# Patient Record
Sex: Female | Born: 1935 | Race: White | Hispanic: No | Smoking: Never smoker
Health system: Southern US, Community
[De-identification: ages and names within clinical notes are randomized; demographics above are authoritative.]

## PROBLEM LIST (undated history)

## (undated) DIAGNOSIS — E119 Type 2 diabetes mellitus without complications: Secondary | ICD-10-CM

## (undated) DIAGNOSIS — C801 Malignant (primary) neoplasm, unspecified: Secondary | ICD-10-CM

## (undated) DIAGNOSIS — M1990 Unspecified osteoarthritis, unspecified site: Secondary | ICD-10-CM

## (undated) DIAGNOSIS — H269 Unspecified cataract: Secondary | ICD-10-CM

## (undated) HISTORY — DX: Unspecified osteoarthritis, unspecified site: M19.90

## (undated) HISTORY — DX: Unspecified cataract: H26.9

## (undated) HISTORY — PX: EYE SURGERY: SHX253

## (undated) HISTORY — PX: ABDOMINAL HYSTERECTOMY: SHX81

## (undated) HISTORY — DX: Type 2 diabetes mellitus without complications: E11.9

## (undated) HISTORY — DX: Malignant (primary) neoplasm, unspecified: C80.1

---

## 2006-07-24 ENCOUNTER — Emergency Department (HOSPITAL_COMMUNITY): Admission: EM | Admit: 2006-07-24 | Discharge: 2006-07-24 | Payer: Self-pay | Admitting: Emergency Medicine

## 2012-09-29 ENCOUNTER — Ambulatory Visit (INDEPENDENT_AMBULATORY_CARE_PROVIDER_SITE_OTHER): Payer: Medicare HMO | Admitting: Emergency Medicine

## 2012-09-29 VITALS — HR 74 | Temp 98.2°F | Resp 18 | Ht <= 58 in | Wt 121.0 lb

## 2012-09-29 DIAGNOSIS — J209 Acute bronchitis, unspecified: Secondary | ICD-10-CM

## 2012-09-29 MED ORDER — GUAIFENESIN-DM 100-10 MG/5ML PO SYRP: 5.0000 mL | ORAL_SOLUTION | Freq: Three times a day (TID) | ORAL | Status: DC | PRN

## 2012-09-29 MED ORDER — AZITHROMYCIN 250 MG PO TABS: ORAL_TABLET | ORAL | Status: DC

## 2012-09-29 NOTE — Progress Notes (Signed)
[facility] [address] [place] [address] [phone]  Date:  09/29/2012   Name:  Molly Ashley   DOB:  07-03-1913   MRN:  [id number]  PCP:  No primary Giavana Rooke on file.    Chief Complaint: Cough and Diarrhea   History of Present Illness:  Molly Ashley is a 79 y.o. very pleasant female patient who presents with the following:  Has a recent onset nonproductive cough not associated with wheezing or shortness of breath.  No fever or chills.  No nausea or vomiting.  No coryza.  Not worse when recumbent.  Has occasional loose stool.  No blood mucous or pus.  No abdominal pain.  Not watery.    There is no problem list on file for this patient.   Past Medical History  Diagnosis Date  . Arthritis   . Cancer   . Diabetes mellitus without complication   . Cataract     Past Surgical History  Procedure Laterality Date  . Abdominal hysterectomy    . Eye surgery      History  Substance Use Topics  . Smoking status: Never Smoker   . Smokeless tobacco: Not on file  . Alcohol Use: No    History reviewed. No pertinent family history.  No Known Allergies  Medication list has been reviewed and updated.  No current outpatient prescriptions on file prior to visit.   No current facility-administered medications on file prior to visit.    Review of Systems:  As per HPI, otherwise negative.    Physical Examination: Filed Vitals:   09/29/12 0954  Pulse: 74  Temp: 98.2 F (36.8 C)  Resp: 18   Filed Vitals:   09/29/12 0954  Height: 4' 8.5" (1.435 m)  Weight: 121 lb (54.885 kg)   Body mass index is 26.65 kg/(m^2). Ideal Body Weight: Weight in (lb) to have BMI = 25: 113.3  GEN: WDWN, NAD, Non-toxic, A & O x 3 HEENT: Atraumatic, Normocephalic. Neck supple. No masses, No LAD. Ears and Nose: No external deformity.  Bilateral hearing aids CV: RRR, No M/G/R. No JVD. No thrill. No extra heart sounds. PULM: CTA B, no wheezes, crackles, rhonchi.  No retractions. No resp. distress. No accessory muscle use. ABD: S, NT, ND, +BS. No rebound. No HSM. EXTR: No c/c/e NEURO Normal gait.  PSYCH: Normally interactive. Conversant. Not depressed or anxious appearing.  Calm demeanor.    Assessment and Plan: Bronchitis robitussion ac zpak  Carmelina Dane, MD

## 2012-09-29 NOTE — Patient Instructions (Addendum)

## 2012-12-03 ENCOUNTER — Ambulatory Visit: Payer: Self-pay | Admitting: Podiatry

## 2013-04-01 ENCOUNTER — Ambulatory Visit (INDEPENDENT_AMBULATORY_CARE_PROVIDER_SITE_OTHER): Payer: Medicare HMO | Admitting: Podiatry

## 2013-04-01 DIAGNOSIS — B351 Tinea unguium: Secondary | ICD-10-CM | POA: Insufficient documentation

## 2013-04-01 DIAGNOSIS — M25579 Pain in unspecified ankle and joints of unspecified foot: Secondary | ICD-10-CM | POA: Insufficient documentation

## 2013-04-01 NOTE — Progress Notes (Signed)
79 year old female presents complaining of painful feet. Left foot is swollen on. Patient came in to have toe nails trimmed.  Objective: Swollen left forefoot lateral 1/2 localized without inflammation or pain at site.  Thick yellow discolorated long nails x 10 with pain. Skin is paper thin with multiple areas of hyperpigmented skin from enlarged capillaries.  No open lesions. No proximal or expending edema or erythema. Pedal pulses are not palpable.  Assessment: Onychomycosis x 10. Painful feet.  Plan: All nails debrided x 10.

## 2013-05-04 ENCOUNTER — Emergency Department (HOSPITAL_COMMUNITY)
Admission: EM | Admit: 2013-05-04 | Discharge: 2013-05-05 | Disposition: A | Discharge status: Home / Self Care | Payer: Medicare HMO | Attending: Emergency Medicine | Admitting: Emergency Medicine

## 2013-05-04 ENCOUNTER — Emergency Department (HOSPITAL_COMMUNITY): Payer: Medicare HMO

## 2013-05-04 ENCOUNTER — Encounter (HOSPITAL_COMMUNITY): Payer: Self-pay | Admitting: *Deleted

## 2013-05-04 DIAGNOSIS — Z859 Personal history of malignant neoplasm, unspecified: Secondary | ICD-10-CM | POA: Insufficient documentation

## 2013-05-04 DIAGNOSIS — Z8669 Personal history of other diseases of the nervous system and sense organs: Secondary | ICD-10-CM | POA: Insufficient documentation

## 2013-05-04 DIAGNOSIS — Z79899 Other long term (current) drug therapy: Secondary | ICD-10-CM | POA: Insufficient documentation

## 2013-05-04 DIAGNOSIS — E119 Type 2 diabetes mellitus without complications: Secondary | ICD-10-CM | POA: Insufficient documentation

## 2013-05-04 DIAGNOSIS — R4182 Altered mental status, unspecified: Secondary | ICD-10-CM | POA: Insufficient documentation

## 2013-05-04 DIAGNOSIS — K5900 Constipation, unspecified: Secondary | ICD-10-CM | POA: Insufficient documentation

## 2013-05-04 DIAGNOSIS — Z7902 Long term (current) use of antithrombotics/antiplatelets: Secondary | ICD-10-CM | POA: Insufficient documentation

## 2013-05-04 DIAGNOSIS — Z8739 Personal history of other diseases of the musculoskeletal system and connective tissue: Secondary | ICD-10-CM | POA: Insufficient documentation

## 2013-05-04 LAB — CBC WITH DIFFERENTIAL/PLATELET
Basophils Absolute: 0.1 10*3/uL (ref 0.0–0.1)
HCT: 32.7 % — ABNORMAL LOW (ref 36.0–46.0)
Lymphocytes Relative: 15 % (ref 12–46)
Neutro Abs: 5 10*3/uL (ref 1.7–7.7)
Platelets: 189 10*3/uL (ref 150–400)
RBC: 3.53 MIL/uL — ABNORMAL LOW (ref 3.87–5.11)
RDW: 12.8 % (ref 11.5–15.5)
WBC: 7.8 10*3/uL (ref 4.0–10.5)

## 2013-05-04 LAB — COMPREHENSIVE METABOLIC PANEL
ALT: 15 U/L (ref 0–35)
AST: 20 U/L (ref 0–37)
Alkaline Phosphatase: 57 U/L (ref 39–117)
BUN: 29 mg/dL — ABNORMAL HIGH (ref 6–23)
CO2: 25 mEq/L (ref 19–32)
Chloride: 103 mEq/L (ref 96–112)
GFR calc Af Amer: 34 mL/min — ABNORMAL LOW (ref >90)
GFR calc non Af Amer: 29 mL/min — ABNORMAL LOW (ref >90)
Potassium: 4.6 mEq/L (ref 3.5–5.1)
Sodium: 136 mEq/L (ref 135–145)
Total Bilirubin: 0.3 mg/dL (ref 0.3–1.2)

## 2013-05-04 LAB — URINALYSIS, ROUTINE W REFLEX MICROSCOPIC
Bilirubin Urine: NEGATIVE
Hgb urine dipstick: NEGATIVE
Ketones, ur: NEGATIVE mg/dL
Nitrite: NEGATIVE
Protein, ur: 100 mg/dL — AB
Specific Gravity, Urine: 1.017 (ref 1.005–1.030)
Urobilinogen, UA: 0.2 mg/dL (ref 0.0–1.0)

## 2013-05-04 LAB — URINE MICROSCOPIC-ADD ON

## 2013-05-04 LAB — POCT I-STAT TROPONIN I: Troponin i, poc: 0 ng/mL (ref 0.00–0.08)

## 2013-05-04 MED ORDER — SODIUM CHLORIDE 0.9 % IV BOLUS (SEPSIS)
1000.0000 mL | Freq: Once | INTRAVENOUS | Status: AC
  Administered 2013-05-04: 1000 mL via INTRAVENOUS
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
## 2013-05-04 NOTE — ED Notes (Signed)
Per ems report, the patients family says the patient was not acting right tonight. The patient says that she was going to dinner at her families house and she could not remember what she did with her dinner. She says she does not know how long it lasted. No obvious weakness or slurred speech on arrival. Pt denies pain. Ambulatory from EMS stretcher to treatment room

## 2013-05-04 NOTE — ED Notes (Signed)
Bed: WA01 Expected date: 05/04/13 Expected time: 8:28 PM Means of arrival: Ambulance Comments: 79 yo F

## 2013-05-05 ENCOUNTER — Emergency Department (HOSPITAL_COMMUNITY): Payer: Medicare HMO

## 2013-05-12 NOTE — ED Provider Notes (Signed)
Medical screening examination/treatment/procedure(s) were conducted as a shared visit with non-physician practitioner(s) and myself.  I personally evaluated the patient during the encounter Pt with some worsening confusion, at baseline now.  No neuro deficits.  Labs unremarkable  Rolan Bucco, MD 05/12/13 2008

## 2013-10-17 ENCOUNTER — Encounter (HOSPITAL_BASED_OUTPATIENT_CLINIC_OR_DEPARTMENT_OTHER): Payer: Self-pay | Admitting: Emergency Medicine

## 2013-10-17 ENCOUNTER — Emergency Department (HOSPITAL_BASED_OUTPATIENT_CLINIC_OR_DEPARTMENT_OTHER)
Admission: EM | Admit: 2013-10-17 | Discharge: 2013-10-17 | Disposition: A | Discharge status: Home / Self Care | Payer: Medicare HMO | Attending: Emergency Medicine | Admitting: Emergency Medicine

## 2013-10-17 DIAGNOSIS — W64XXXA Exposure to other animate mechanical forces, initial encounter: Secondary | ICD-10-CM | POA: Insufficient documentation

## 2013-10-17 DIAGNOSIS — Y939 Activity, unspecified: Secondary | ICD-10-CM | POA: Insufficient documentation

## 2013-10-17 DIAGNOSIS — S91009A Unspecified open wound, unspecified ankle, initial encounter: Principal | ICD-10-CM

## 2013-10-17 DIAGNOSIS — Z859 Personal history of malignant neoplasm, unspecified: Secondary | ICD-10-CM | POA: Insufficient documentation

## 2013-10-17 DIAGNOSIS — Z7902 Long term (current) use of antithrombotics/antiplatelets: Secondary | ICD-10-CM | POA: Insufficient documentation

## 2013-10-17 DIAGNOSIS — Z8739 Personal history of other diseases of the musculoskeletal system and connective tissue: Secondary | ICD-10-CM | POA: Insufficient documentation

## 2013-10-17 DIAGNOSIS — Y929 Unspecified place or not applicable: Secondary | ICD-10-CM | POA: Insufficient documentation

## 2013-10-17 DIAGNOSIS — Z792 Long term (current) use of antibiotics: Secondary | ICD-10-CM | POA: Insufficient documentation

## 2013-10-17 DIAGNOSIS — S81009A Unspecified open wound, unspecified knee, initial encounter: Secondary | ICD-10-CM | POA: Insufficient documentation

## 2013-10-17 DIAGNOSIS — S81809A Unspecified open wound, unspecified lower leg, initial encounter: Principal | ICD-10-CM

## 2013-10-17 DIAGNOSIS — S81819A Laceration without foreign body, unspecified lower leg, initial encounter: Secondary | ICD-10-CM

## 2013-10-17 DIAGNOSIS — Z8669 Personal history of other diseases of the nervous system and sense organs: Secondary | ICD-10-CM | POA: Insufficient documentation

## 2013-10-17 DIAGNOSIS — Z79899 Other long term (current) drug therapy: Secondary | ICD-10-CM | POA: Insufficient documentation

## 2013-10-17 DIAGNOSIS — E119 Type 2 diabetes mellitus without complications: Secondary | ICD-10-CM | POA: Insufficient documentation

## 2013-10-17 MED ORDER — AMOXICILLIN-POT CLAVULANATE 875-125 MG PO TABS: 1.0000 | ORAL_TABLET | Freq: Two times a day (BID) | ORAL | Status: DC

## 2013-10-17 NOTE — ED Provider Notes (Signed)
CSN: [id number]     Arrival date & time 10/17/13  1643 History   First MD Initiated Contact with Patient 10/17/13 1647     Chief Complaint  Patient presents with  . Extremity Laceration     (Consider location/radiation/quality/duration/timing/severity/associated sxs/prior Treatment) HPI Comments: Pt state that her granddaughters dog jumped on the pt and the dogs nail scratched her right lower leg a short time ago. Pt cleaned the area and dressed it. Dogs shots are utd. No concern for fb. Pt tetanus is utd;denies numbness or weakness  The history is provided by the patient. No language interpreter was used.    Past Medical History  Diagnosis Date  . Arthritis   . Cancer   . Diabetes mellitus without complication   . Cataract    Past Surgical History  Procedure Laterality Date  . Abdominal hysterectomy    . Eye surgery     No family history on file. History  Substance Use Topics  . Smoking status: Never Smoker   . Smokeless tobacco: Not on file  . Alcohol Use: No   OB History   Grav Para Term Preterm Abortions TAB SAB Ect Mult Living                 Review of Systems  Constitutional: Negative.   Respiratory: Negative.   Cardiovascular: Negative.       Allergies  Review of patient's allergies indicates no known allergies.  Home Medications   Current Outpatient Rx  Name  Route  Sig  Dispense  Refill  . clopidogrel (PLAVIX) 75 MG tablet   Oral   Take 75 mg by mouth daily.         Marland Kitchen doxycycline (VIBRAMYCIN) 100 MG capsule   Oral   Take 100 mg by mouth 2 (two) times daily. Started 05/02/13  14 days         . glipiZIDE (GLUCOTROL) 5 MG tablet   Oral   Take 5 mg by mouth daily.          Vladimir Faster Glycol-Propyl Glycol (SYSTANE OP)   Ophthalmic   Apply 1 drop to eye as needed (dry eyes).         . propranolol (INDERAL) 60 MG tablet   Oral   Take 30 mg by mouth 2 (two) times daily.         . simvastatin (ZOCOR) 10 MG tablet   Oral   Take 10  mg by mouth at bedtime.         . sitaGLIPtin (JANUVIA) 25 MG tablet   Oral   Take 25 mg by mouth daily.         . traMADol (ULTRAM) 50 MG tablet   Oral   Take 50 mg by mouth every 12 (twelve) hours as needed for pain.         Marland Kitchen triamcinolone cream (KENALOG) 0.1 %                BP   Pulse 80  Temp(Src) 98.6 F (37 C) (Oral)  SpO2 100% Physical Exam  Nursing note and vitals reviewed. Constitutional: She is oriented to person, place, and time. She appears well-developed and well-nourished.  Cardiovascular: Normal rate and regular rhythm.   Pulmonary/Chest: Effort normal.  Musculoskeletal: Normal range of motion.  Neurological: She is alert and oriented to person, place, and time.  Skin:  Pt has a vertical laceration to the right shin 13 cm in length. Pt ambulatory without any problem  ED Course  LACERATION REPAIR Date/Time: 10/17/2013 5:11 PM Performed by: Glendell Docker Authorized by: Glendell Docker Consent: Verbal consent obtained. Risks and benefits: risks, benefits and alternatives were discussed Consent given by: patient Patient identity confirmed: verbally with patient Body area: lower extremity Location details: right lower leg Laceration length: 12 cm Foreign bodies: no foreign bodies Amount of cleaning: standard Skin closure: glue and Steri-Strips Approximation: close Approximation difficulty: simple Comments: Do to thin skin it was repaired with glue and steri strips   (including critical care time) Labs Review Labs Reviewed - No data to display Imaging Review No results found.   EKG Interpretation None      MDM   Final diagnoses:  Laceration of leg    Will treat with antibiotics related to it being a dog injury   Glendell Docker, NP 10/17/13 1712

## 2013-10-17 NOTE — ED Notes (Signed)
Pt's granddaughter's dog jumped up on pt bare right leg-scratched with nails-dog's UTD

## 2013-10-17 NOTE — Discharge Instructions (Signed)

## 2013-10-17 NOTE — ED Provider Notes (Signed)
Medical screening examination/treatment/procedure(s) were performed by non-physician practitioner and as supervising physician I was immediately available for consultation/collaboration.   EKG Interpretation None        Blanchard Kelch, MD 10/17/13 2325

## 2013-12-24 ENCOUNTER — Ambulatory Visit: Payer: Medicare HMO | Admitting: Podiatry

## 2014-01-27 ENCOUNTER — Ambulatory Visit (INDEPENDENT_AMBULATORY_CARE_PROVIDER_SITE_OTHER): Payer: Medicare HMO | Admitting: Podiatry

## 2014-01-27 ENCOUNTER — Encounter: Payer: Self-pay | Admitting: Podiatry

## 2014-01-27 DIAGNOSIS — M79609 Pain in unspecified limb: Secondary | ICD-10-CM

## 2014-01-27 DIAGNOSIS — M79606 Pain in leg, unspecified: Secondary | ICD-10-CM | POA: Insufficient documentation

## 2014-01-27 DIAGNOSIS — B351 Tinea unguium: Secondary | ICD-10-CM

## 2014-01-27 NOTE — Progress Notes (Signed)
SUBJECTIVE: 80 y.o. year old female presents requesting toe nails trimmed. She has been cut too many times and is scared to have anyone to cut her nails.   OBJECTIVE: DERMATOLOGIC EXAMINATION: Nails: Thick and dystrophic with yellow debris.  Skin is pale, thin, and flaky. VASCULAR EXAMINATION OF LOWER LIMBS: Pedal pulses: Pedal pulses are not palpable. No edema or erythema.  NEUROLOGIC EXAMINATION OF THE LOWER LIMBS: Decreased response to Monofilament sensory testing.  MUSCULOSKELETAL EXAMINATION: Rectus foot without gross deformities.  ASSESSMENT: Onychomycosis. PVD.  PLAN: Palliation prn. All nails debrided.

## 2014-01-27 NOTE — Patient Instructions (Signed)
Seen for hypertrophic nails. All nails debrided. Return in 3 months or as needed.  

## 2014-04-29 ENCOUNTER — Ambulatory Visit: Payer: Medicare HMO | Admitting: Podiatry

## 2014-06-24 ENCOUNTER — Encounter (HOSPITAL_COMMUNITY): Payer: Self-pay | Admitting: Emergency Medicine

## 2014-06-24 ENCOUNTER — Emergency Department (HOSPITAL_COMMUNITY)
Admission: EM | Admit: 2014-06-24 | Discharge: 2014-06-24 | Disposition: A | Discharge status: Home / Self Care | Payer: Medicare HMO | Attending: Emergency Medicine | Admitting: Emergency Medicine

## 2014-06-24 ENCOUNTER — Emergency Department (HOSPITAL_COMMUNITY): Payer: Medicare HMO

## 2014-06-24 DIAGNOSIS — Z792 Long term (current) use of antibiotics: Secondary | ICD-10-CM | POA: Insufficient documentation

## 2014-06-24 DIAGNOSIS — S0083XA Contusion of other part of head, initial encounter: Secondary | ICD-10-CM | POA: Diagnosis not present

## 2014-06-24 DIAGNOSIS — S40012A Contusion of left shoulder, initial encounter: Secondary | ICD-10-CM | POA: Diagnosis not present

## 2014-06-24 DIAGNOSIS — Y9289 Other specified places as the place of occurrence of the external cause: Secondary | ICD-10-CM | POA: Insufficient documentation

## 2014-06-24 DIAGNOSIS — Z8669 Personal history of other diseases of the nervous system and sense organs: Secondary | ICD-10-CM | POA: Diagnosis not present

## 2014-06-24 DIAGNOSIS — E119 Type 2 diabetes mellitus without complications: Secondary | ICD-10-CM | POA: Diagnosis not present

## 2014-06-24 DIAGNOSIS — Y998 Other external cause status: Secondary | ICD-10-CM | POA: Insufficient documentation

## 2014-06-24 DIAGNOSIS — M1990 Unspecified osteoarthritis, unspecified site: Secondary | ICD-10-CM | POA: Diagnosis not present

## 2014-06-24 DIAGNOSIS — Y9389 Activity, other specified: Secondary | ICD-10-CM | POA: Diagnosis not present

## 2014-06-24 DIAGNOSIS — Z9849 Cataract extraction status, unspecified eye: Secondary | ICD-10-CM | POA: Diagnosis not present

## 2014-06-24 DIAGNOSIS — Z859 Personal history of malignant neoplasm, unspecified: Secondary | ICD-10-CM | POA: Insufficient documentation

## 2014-06-24 DIAGNOSIS — W19XXXA Unspecified fall, initial encounter: Secondary | ICD-10-CM | POA: Insufficient documentation

## 2014-06-24 DIAGNOSIS — Z79899 Other long term (current) drug therapy: Secondary | ICD-10-CM | POA: Diagnosis not present

## 2014-06-24 DIAGNOSIS — S4992XA Unspecified injury of left shoulder and upper arm, initial encounter: Secondary | ICD-10-CM | POA: Diagnosis present

## 2014-06-24 NOTE — ED Notes (Signed)
Pt ambulated on the hallway with assistance, pt still very unsteady on her feet, pt encouraged to use a walker or a cane at home, but pt refuses to do this, states she still strong to walk by her self.

## 2014-06-24 NOTE — ED Notes (Signed)
Pt c/o very bad leg cramping.

## 2014-06-24 NOTE — ED Notes (Signed)
Pt refusing discharge vitals, insisting that she needs to leave.
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
## 2014-06-24 NOTE — Discharge Instructions (Signed)
Fall Prevention and Home Safety Falls cause injuries and can affect all age groups. It is possible to use preventive measures to significantly decrease the likelihood of falls. There are many simple measures which can make your home safer and prevent falls. OUTDOORS  Repair cracks and edges of walkways and driveways.  Remove high doorway thresholds.  Trim shrubbery on the main path into your home.  Have good outside lighting.  Clear walkways of tools, rocks, debris, and clutter.  Check that handrails are not broken and are securely fastened. Both sides of steps should have handrails.  Have leaves, snow, and ice cleared regularly.  Use sand or salt on walkways during winter months.  In the garage, clean up grease or oil spills. BATHROOM  Install night lights.  Install grab bars by the toilet and in the tub and shower.  Use non-skid mats or decals in the tub or shower.  Place a plastic non-slip stool in the shower to sit on, if needed.  Keep floors dry and clean up all water on the floor immediately.  Remove soap buildup in the tub or shower on a regular basis.  Secure bath mats with non-slip, double-sided rug tape.  Remove throw rugs and tripping hazards from the floors. BEDROOMS  Install night lights.  Make sure a bedside light is easy to reach.  Do not use oversized bedding.  Keep a telephone by your bedside.  Have a firm chair with side arms to use for getting dressed.  Remove throw rugs and tripping hazards from the floor. KITCHEN  Keep handles on pots and pans turned toward the center of the stove. Use back burners when possible.  Clean up spills quickly and allow time for drying.  Avoid walking on wet floors.  Avoid hot utensils and knives.  Position shelves so they are not too high or low.  Place commonly used objects within easy reach.  If necessary, use a sturdy step stool with a grab bar when reaching.  Keep electrical cables out of the  way.  Do not use floor polish or wax that makes floors slippery. If you must use wax, use non-skid floor wax.  Remove throw rugs and tripping hazards from the floor. STAIRWAYS  Never leave objects on stairs.  Place handrails on both sides of stairways and use them. Fix any loose handrails. Make sure handrails on both sides of the stairways are as long as the stairs.  Check carpeting to make sure it is firmly attached along stairs. Make repairs to worn or loose carpet promptly.  Avoid placing throw rugs at the top or bottom of stairways, or properly secure the rug with carpet tape to prevent slippage. Get rid of throw rugs, if possible.  Have an electrician put in a light switch at the top and bottom of the stairs. OTHER FALL PREVENTION TIPS  Wear low-heel or rubber-soled shoes that are supportive and fit well. Wear closed toe shoes.  When using a stepladder, make sure it is fully opened and both spreaders are firmly locked. Do not climb a closed stepladder.  Add color or contrast paint or tape to grab bars and handrails in your home. Place contrasting color strips on first and last steps.  Learn and use mobility aids as needed. Install an electrical emergency response system.  Turn on lights to avoid dark areas. Replace light bulbs that burn out immediately. Get light switches that glow.  Arrange furniture to create clear pathways. Keep furniture in the same place.  Firmly attach carpet with non-skid or double-sided tape.  Eliminate uneven floor surfaces.  Select a carpet pattern that does not visually hide the edge of steps.  Be aware of all pets. OTHER HOME SAFETY TIPS  Set the water temperature for 120 F (48.8 C).  Keep emergency numbers on or near the telephone.  Keep smoke detectors on every level of the home and near sleeping areas. Document Released: 07/07/2002 Document Revised: 01/16/2012 Document Reviewed: 10/06/2011 [facility] Patient Information 2015  [place], [place]. This information is not intended to replace advice given to you by your health care provider. Make sure you discuss any questions you have with your health care provider.  Facial or Scalp Contusion A facial or scalp contusion is a deep bruise on the face or head. Injuries to the face and head generally cause a lot of swelling, especially around the eyes. Contusions are the result of an injury that caused bleeding under the skin. The contusion may turn blue, purple, or yellow. Minor injuries will give you a painless contusion, but more severe contusions may stay painful and swollen for a few weeks.  CAUSES  A facial or scalp contusion is caused by a blunt injury or trauma to the face or head area.  SIGNS AND SYMPTOMS   Swelling of the injured area.   Discoloration of the injured area.   Tenderness, soreness, or pain in the injured area.  DIAGNOSIS  The diagnosis can be made by taking a medical history and doing a physical exam. An X-ray exam, CT scan, or MRI may be needed to determine if there are any associated injuries, such as broken bones (fractures). TREATMENT  Often, the best treatment for a facial or scalp contusion is applying cold compresses to the injured area. Over-the-counter medicines may also be recommended for pain control.  HOME CARE INSTRUCTIONS   Only take over-the-counter or prescription medicines as directed by your health care provider.   Apply ice to the injured area.   Put ice in a plastic bag.   Place a towel between your skin and the bag.   Leave the ice on for 20 minutes, 2-3 times a day.  SEEK MEDICAL CARE IF:  You have bite problems.   You have pain with chewing.   You are concerned about facial defects. SEEK IMMEDIATE MEDICAL CARE IF:  You have severe pain or a headache that is not relieved by medicine.   You have unusual sleepiness, confusion, or personality changes.   You throw up (vomit).   You have a persistent  nosebleed.   You have double vision or blurred vision.   You have fluid drainage from your nose or ear.   You have difficulty walking or using your arms or legs.  MAKE SURE YOU:   Understand these instructions.  Will watch your condition.  Will get help right away if you are not doing well or get worse. Document Released: 08/24/2004 Document Revised: 05/07/2013 Document Reviewed: 02/27/2013 [facility] Patient Information 2015 [place], [place]. This information is not intended to replace advice given to you by your health care provider. Make sure you discuss any questions you have with your health care provider.  Contusion A contusion is a deep bruise. Contusions are the result of an injury that caused bleeding under the skin. The contusion may turn blue, purple, or yellow. Minor injuries will give you a painless contusion, but more severe contusions may stay painful and swollen for a few weeks.  CAUSES  A contusion is  usually caused by a blow, trauma, or direct force to an area of the body. SYMPTOMS   Swelling and redness of the injured area.  Bruising of the injured area.  Tenderness and soreness of the injured area.  Pain. DIAGNOSIS  The diagnosis can be made by taking a history and physical exam. An X-ray, CT scan, or MRI may be needed to determine if there were any associated injuries, such as fractures. TREATMENT  Specific treatment will depend on what area of the body was injured. In general, the best treatment for a contusion is resting, icing, elevating, and applying cold compresses to the injured area. Over-the-counter medicines may also be recommended for pain control. Ask your caregiver what the best treatment is for your contusion. HOME CARE INSTRUCTIONS   Put ice on the injured area.  Put ice in a plastic bag.  Place a towel between your skin and the bag.  Leave the ice on for 15-20 minutes, 3-4 times a day, or as directed by your health care provider.  Only  take over-the-counter or prescription medicines for pain, discomfort, or fever as directed by your caregiver. Your caregiver may recommend avoiding anti-inflammatory medicines (aspirin, ibuprofen, and naproxen) for 48 hours because these medicines may increase bruising.  Rest the injured area.  If possible, elevate the injured area to reduce swelling. SEEK IMMEDIATE MEDICAL CARE IF:   You have increased bruising or swelling.  You have pain that is getting worse.  Your swelling or pain is not relieved with medicines. MAKE SURE YOU:   Understand these instructions.  Will watch your condition.  Will get help right away if you are not doing well or get worse. Document Released: 04/26/2005 Document Revised: 07/22/2013 Document Reviewed: 05/22/2011 [facility] Patient Information 2015 [place], [place]. This information is not intended to replace advice given to you by your health care provider. Make sure you discuss any questions you have with your health care provider.

## 2014-06-24 NOTE — ED Provider Notes (Signed)
CSN: [id number]     Arrival date & time 06/24/14  0108 History  This chart was scribed for Molly Delay, MD by Evelene Croon, ED Scribe. This patient was seen in room B16C/B16C and the patient's care was started 1:21 AM.    Chief Complaint  Patient presents with  . Fall    The history is provided by the patient. No language interpreter was used.    HPI Comments:  Molly Ashley is a 80 y.o. female who presents to the Emergency Department s/p unwitnessed fall this am complaining of moderate-severe pain to her left shoulder following the incident. She reports associated pain surrounding site of bruising to left forehead where she injured her head during fall and mild pain to her left knee. She states she was in her bedroom when she fell but cannot recall what caused the fall. She denies LOC and a h/o recent falls. She also denies back pain. No alleviating factors noted. She has a h/o fx to the right shoulder.   Past Medical History  Diagnosis Date  . Arthritis   . Cancer   . Diabetes mellitus without complication   . Cataract    Past Surgical History  Procedure Laterality Date  . Abdominal hysterectomy    . Eye surgery     No family history on file. History  Substance Use Topics  . Smoking status: Never Smoker   . Smokeless tobacco: Never Used  . Alcohol Use: No   OB History    No data available     Review of Systems  Musculoskeletal: Positive for myalgias and arthralgias. Negative for back pain.  Skin: Positive for color change.  All other systems reviewed and are negative.     Allergies  Review of patient's allergies indicates no known allergies.  Home Medications   Prior to Admission medications   Medication Sig Start Date End Date Taking? Authorizing Provider  amoxicillin-clavulanate (AUGMENTIN) 875-125 MG per tablet Take 1 tablet by mouth every 12 (twelve) hours. 10/17/13   Glendell Docker, NP  clopidogrel (PLAVIX) 75 MG tablet Take 75 mg by mouth daily.     Historical Provider, MD  doxycycline (VIBRAMYCIN) 100 MG capsule Take 100 mg by mouth 2 (two) times daily. Started 05/02/13  14 days    Historical Provider, MD  glipiZIDE (GLUCOTROL) 5 MG tablet Take 5 mg by mouth daily.  03/23/13   Historical Provider, MD  Polyethyl Glycol-Propyl Glycol (SYSTANE OP) Apply 1 drop to eye as needed (dry eyes).    Historical Provider, MD  propranolol (INDERAL) 60 MG tablet Take 30 mg by mouth 2 (two) times daily.    Historical Provider, MD  simvastatin (ZOCOR) 10 MG tablet Take 10 mg by mouth at bedtime.    Historical Provider, MD  sitaGLIPtin (JANUVIA) 25 MG tablet Take 25 mg by mouth daily.    Historical Provider, MD  traMADol (ULTRAM) 50 MG tablet Take 50 mg by mouth every 12 (twelve) hours as needed for pain.    Historical Provider, MD  triamcinolone cream (KENALOG) 0.1 %  02/04/13   Historical Provider, MD   There were no vitals taken for this visit. Physical Exam  Constitutional: She is oriented to person, place, and time. She appears well-developed and well-nourished. No distress.  HENT:  Head: Normocephalic and atraumatic. Head is without raccoon's eyes and without Battle's sign.    Nose: Nose normal.  Eyes: Conjunctivae and EOM are normal. Pupils are equal, round, and reactive to light. No scleral icterus.  Neck: No spinous process tenderness and no muscular tenderness present.  Cardiovascular: Normal rate, regular rhythm, normal heart sounds and intact distal pulses.   No murmur heard. Pulmonary/Chest: Effort normal and breath sounds normal. She has no rales. She exhibits no tenderness.  Abdominal: Soft. There is no tenderness. There is no rebound and no guarding.  Musculoskeletal: Normal range of motion. She exhibits no edema.       Left shoulder: She exhibits tenderness and bony tenderness. She exhibits no deformity, no laceration, normal pulse and normal strength.       Thoracic back: She exhibits no tenderness and no bony tenderness.       Lumbar  back: She exhibits no tenderness and no bony tenderness.  No evidence of trauma to extremities, except as noted.  2+ distal pulses.    Neurological: She is alert and oriented to person, place, and time.  Skin: Skin is warm and dry. No rash noted.  Psychiatric: She has a normal mood and affect.  Nursing note and vitals reviewed.   ED Course  Procedures  DIAGNOSTIC STUDIES:  Oxygen Saturation is 100% on RA, normal by my interpretation.    COORDINATION OF CARE:  1:30 AM Will order x-rays of the left shoulder. Discussed treatment plan with pt at bedside and pt agreed to plan. Pt declined pain meds at this time.  Labs Review Labs Reviewed - No data to display  Imaging Review Ct Head Wo Contrast  06/24/2014   CLINICAL DATA:  Unwitnessed fall.  EXAM: CT HEAD WITHOUT CONTRAST  CT CERVICAL SPINE WITHOUT CONTRAST  TECHNIQUE: Multidetector CT imaging of the head and cervical spine was performed following the standard protocol without intravenous contrast. Multiplanar CT image reconstructions of the cervical spine were also generated.  COMPARISON:  None.  FINDINGS: CT HEAD FINDINGS  Prominence of the sulci and ventricles are identified compatible with brain atrophy. There is mild low attenuation within the subcortical and periventricular white matter. No acute cortical infarct, hemorrhage, or mass lesion ispresent. No significant extra-axial fluid collection is present. The paranasal sinuses andmastoid air cells are clear. The osseous skull is intact. Left frontal scalp hematoma is identified which measures 2.1 x 0.9 cm. There is no underlying skull fracture.  CT CERVICAL SPINE FINDINGS  Normal alignment of the cervical spine. There is multi level disc space narrowing and ventral endplate spurring identified. The prevertebral soft tissue space appears normal. The facet joints are aligned.  IMPRESSION: 1. No acute intracranial abnormalities. 2. Small vessel ischemic disease and brain atrophy. 3. No  evidence for cervical spine fracture. 4. Cervical spondylosis.   Electronically Signed   By: Kerby Moors M.D.   On: 06/24/2014 02:44   Ct Cervical Spine Wo Contrast  06/24/2014   CLINICAL DATA:  Unwitnessed fall.  EXAM: CT HEAD WITHOUT CONTRAST  CT CERVICAL SPINE WITHOUT CONTRAST  TECHNIQUE: Multidetector CT imaging of the head and cervical spine was performed following the standard protocol without intravenous contrast. Multiplanar CT image reconstructions of the cervical spine were also generated.  COMPARISON:  None.  FINDINGS: CT HEAD FINDINGS  Prominence of the sulci and ventricles are identified compatible with brain atrophy. There is mild low attenuation within the subcortical and periventricular white matter. No acute cortical infarct, hemorrhage, or mass lesion ispresent. No significant extra-axial fluid collection is present. The paranasal sinuses andmastoid air cells are clear. The osseous skull is intact. Left frontal scalp hematoma is identified which measures 2.1 x 0.9 cm. There is no underlying skull  fracture.  CT CERVICAL SPINE FINDINGS  Normal alignment of the cervical spine. There is multi level disc space narrowing and ventral endplate spurring identified. The prevertebral soft tissue space appears normal. The facet joints are aligned.  IMPRESSION: 1. No acute intracranial abnormalities. 2. Small vessel ischemic disease and brain atrophy. 3. No evidence for cervical spine fracture. 4. Cervical spondylosis.   Electronically Signed   By: Kerby Moors M.D.   On: 06/24/2014 02:44   Dg Shoulder Left  06/24/2014   CLINICAL DATA:  Fall with left shoulder bruising.  EXAM: LEFT SHOULDER - 2+ VIEW  COMPARISON:  None.  FINDINGS: Located glenohumeral and acromioclavicular joints. No convincing fracture. There is chronic insertional changes to the greater tuberosity. Spurring is noted around the glenoid. Osteopenia.  IMPRESSION: No acute osseous findings.   Electronically Signed   By: Jorje Guild M.D.   On: 06/24/2014 03:06   Dg Knee Complete 4 Views Left  06/24/2014   CLINICAL DATA:  Fall with left knee bruising.  Initial encounter  EXAM: LEFT KNEE - COMPLETE 4+ VIEW  COMPARISON:  None.  FINDINGS: There is no evidence of fracture, dislocation, or joint effusion. There is mild diffuse joint narrowing without significant spurring.  Osteopenia.  SFA atherosclerosis.  IMPRESSION: No acute osseous findings.   Electronically Signed   By: Jorje Guild M.D.   On: 06/24/2014 03:08  All radiology studies independently viewed by me.      EKG Interpretation   Date/Time:  Wednesday June 24 2014 01:22:55 EST Ventricular Rate:  69 PR Interval:  207 QRS Duration: 88 QT Interval:  425 QTC Calculation: 455 R Axis:   -16 Text Interpretation:  Sinus rhythm Borderline left axis deviation Anterior  infarct, old Baseline wander in lead(s) V4 No significant change was found  Confirmed by [facility]  MD, TREY ([id number]) on 06/24/2014 2:21:46 AM      MDM   Final diagnoses:  Fall  Forehead contusion, initial encounter  Shoulder contusion, left, initial encounter    80 yo female presenting after an unwitnessed fall.  Her daughter thinks she may have fallen out of bed. Complains of pain in her forehead and left shoulder. Imaging was negative. Exam is reassuring.   she was very well appearing, especially for her age.  Of note, her blood pressure is listed as 280/81. However, patient was very uncooperative while staff was attempting to obtain this vital sign. Pt unwilling to let us re-attempt BP.  Her    I personally performed the services described in this documentation, which was scribed in my presence. The recorded information has been reviewed and is accurate.     Molly Delay, MD 06/24/14 [id number]

## 2014-06-27 ENCOUNTER — Emergency Department (HOSPITAL_COMMUNITY): Payer: Medicare HMO

## 2014-06-27 ENCOUNTER — Encounter (HOSPITAL_COMMUNITY): Payer: Self-pay | Admitting: *Deleted

## 2014-06-27 ENCOUNTER — Emergency Department (HOSPITAL_COMMUNITY)
Admission: EM | Admit: 2014-06-27 | Discharge: 2014-06-27 | Disposition: A | Discharge status: Home / Self Care | Payer: Medicare HMO | Attending: Emergency Medicine | Admitting: Emergency Medicine

## 2014-06-27 DIAGNOSIS — W1839XD Other fall on same level, subsequent encounter: Secondary | ICD-10-CM | POA: Insufficient documentation

## 2014-06-27 DIAGNOSIS — S40022D Contusion of left upper arm, subsequent encounter: Secondary | ICD-10-CM

## 2014-06-27 DIAGNOSIS — W19XXXA Unspecified fall, initial encounter: Secondary | ICD-10-CM

## 2014-06-27 DIAGNOSIS — Z8589 Personal history of malignant neoplasm of other organs and systems: Secondary | ICD-10-CM | POA: Insufficient documentation

## 2014-06-27 DIAGNOSIS — Z79899 Other long term (current) drug therapy: Secondary | ICD-10-CM | POA: Insufficient documentation

## 2014-06-27 DIAGNOSIS — M1990 Unspecified osteoarthritis, unspecified site: Secondary | ICD-10-CM | POA: Insufficient documentation

## 2014-06-27 DIAGNOSIS — E119 Type 2 diabetes mellitus without complications: Secondary | ICD-10-CM | POA: Insufficient documentation

## 2014-06-27 LAB — CBG MONITORING, ED: Glucose-Capillary: 226 mg/dL — ABNORMAL HIGH (ref 70–99)

## 2014-06-27 MED ORDER — TRAMADOL HCL 50 MG PO TABS: 50.0000 mg | ORAL_TABLET | Freq: Two times a day (BID) | ORAL | Status: DC | PRN

## 2014-06-27 MED ORDER — TRAMADOL HCL 50 MG PO TABS: 50.0000 mg | ORAL_TABLET | Freq: Four times a day (QID) | ORAL | Status: DC | PRN

## 2014-06-27 MED ORDER — TRAMADOL HCL 50 MG PO TABS
50.0000 mg | ORAL_TABLET | Freq: Once | ORAL | Status: AC
  Administered 2014-06-27: 50 mg via ORAL
  Filled 2014-06-27: qty 1

## 2014-06-27 NOTE — ED Notes (Signed)
Pt with family for re-evaluation after a fall on Wednesday, pt seen for same at that time and xrays of left shoulder were negative, pt has continued pain and swelling to left hand, forearm and upper arm, states movement creates pain in left shoulder, denies any new symptoms

## 2014-06-27 NOTE — Discharge Instructions (Signed)
Contusion °A contusion is a deep bruise. Contusions are the result of an injury that caused bleeding under the skin. The contusion may turn blue, purple, or yellow. Minor injuries will give you a painless contusion, but more severe contusions may stay painful and swollen for a few weeks.  °CAUSES  °A contusion is usually caused by a blow, trauma, or direct force to an area of the body. °SYMPTOMS  °· Swelling and redness of the injured area. °· Bruising of the injured area. °· Tenderness and soreness of the injured area. °· Pain. °DIAGNOSIS  °The diagnosis can be made by taking a history and physical exam. An X-ray, CT scan, or MRI may be needed to determine if there were any associated injuries, such as fractures. °TREATMENT  °Specific treatment will depend on what area of the body was injured. In general, the best treatment for a contusion is resting, icing, elevating, and applying cold compresses to the injured area. Over-the-counter medicines may also be recommended for pain control. Ask your caregiver what the best treatment is for your contusion. °HOME CARE INSTRUCTIONS  °· Put ice on the injured area. °¨ Put ice in a plastic bag. °¨ Place a towel between your skin and the bag. °¨ Leave the ice on for 15-20 minutes, 3-4 times a day, or as directed by your health care provider. °· Only take over-the-counter or prescription medicines for pain, discomfort, or fever as directed by your caregiver. Your caregiver may recommend avoiding anti-inflammatory medicines (aspirin, ibuprofen, and naproxen) for 48 hours because these medicines may increase bruising. °· Rest the injured area. °· If possible, elevate the injured area to reduce swelling. °SEEK IMMEDIATE MEDICAL CARE IF:  °· You have increased bruising or swelling. °· You have pain that is getting worse. °· Your swelling or pain is not relieved with medicines. °MAKE SURE YOU:  °· Understand these instructions. °· Will watch your condition. °· Will get help right  away if you are not doing well or get worse. °Document Released: 04/26/2005 Document Revised: 07/22/2013 Document Reviewed: 05/22/2011 °[employer]® Patient Information ©2015 [employer]. This information is not intended to replace advice given to you by your health care provider. Make sure you discuss any questions you have with your health care provider. ° °

## 2014-06-30 ENCOUNTER — Encounter (HOSPITAL_COMMUNITY): Payer: Self-pay

## 2014-06-30 ENCOUNTER — Inpatient Hospital Stay (HOSPITAL_COMMUNITY): Payer: Medicare HMO

## 2014-06-30 ENCOUNTER — Emergency Department (HOSPITAL_COMMUNITY): Payer: Medicare HMO

## 2014-06-30 ENCOUNTER — Inpatient Hospital Stay (HOSPITAL_COMMUNITY)
Admission: EM | Admit: 2014-06-30 | Discharge: 2014-07-06 | DRG: 871 | Disposition: A | Discharge status: Skilled Nursing Facility | Payer: Medicare HMO | Attending: Internal Medicine | Admitting: Internal Medicine

## 2014-06-30 ENCOUNTER — Encounter (HOSPITAL_COMMUNITY): Payer: Self-pay | Admitting: Emergency Medicine

## 2014-06-30 ENCOUNTER — Emergency Department (HOSPITAL_COMMUNITY)
Admission: EM | Admit: 2014-06-30 | Discharge: 2014-06-30 | Disposition: A | Discharge status: Home / Self Care | Payer: Medicare HMO | Source: Home / Self Care | Attending: Emergency Medicine | Admitting: Emergency Medicine

## 2014-06-30 DIAGNOSIS — Z955 Presence of coronary angioplasty implant and graft: Secondary | ICD-10-CM

## 2014-06-30 DIAGNOSIS — L89151 Pressure ulcer of sacral region, stage 1: Secondary | ICD-10-CM | POA: Diagnosis present

## 2014-06-30 DIAGNOSIS — W19XXXA Unspecified fall, initial encounter: Secondary | ICD-10-CM | POA: Diagnosis present

## 2014-06-30 DIAGNOSIS — E872 Acidosis: Secondary | ICD-10-CM | POA: Diagnosis present

## 2014-06-30 DIAGNOSIS — E785 Hyperlipidemia, unspecified: Secondary | ICD-10-CM | POA: Diagnosis present

## 2014-06-30 DIAGNOSIS — Z66 Do not resuscitate: Secondary | ICD-10-CM | POA: Diagnosis present

## 2014-06-30 DIAGNOSIS — K5900 Constipation, unspecified: Secondary | ICD-10-CM | POA: Diagnosis not present

## 2014-06-30 DIAGNOSIS — J811 Chronic pulmonary edema: Secondary | ICD-10-CM | POA: Diagnosis present

## 2014-06-30 DIAGNOSIS — B999 Unspecified infectious disease: Secondary | ICD-10-CM

## 2014-06-30 DIAGNOSIS — R0600 Dyspnea, unspecified: Secondary | ICD-10-CM | POA: Diagnosis present

## 2014-06-30 DIAGNOSIS — E119 Type 2 diabetes mellitus without complications: Secondary | ICD-10-CM | POA: Diagnosis present

## 2014-06-30 DIAGNOSIS — I2510 Atherosclerotic heart disease of native coronary artery without angina pectoris: Secondary | ICD-10-CM | POA: Diagnosis present

## 2014-06-30 DIAGNOSIS — R296 Repeated falls: Secondary | ICD-10-CM | POA: Diagnosis present

## 2014-06-30 DIAGNOSIS — A419 Sepsis, unspecified organism: Principal | ICD-10-CM | POA: Diagnosis present

## 2014-06-30 DIAGNOSIS — S42302A Unspecified fracture of shaft of humerus, left arm, initial encounter for closed fracture: Secondary | ICD-10-CM

## 2014-06-30 DIAGNOSIS — E8889 Other specified metabolic disorders: Secondary | ICD-10-CM | POA: Diagnosis present

## 2014-06-30 DIAGNOSIS — N3000 Acute cystitis without hematuria: Secondary | ICD-10-CM | POA: Insufficient documentation

## 2014-06-30 DIAGNOSIS — N179 Acute kidney failure, unspecified: Secondary | ICD-10-CM

## 2014-06-30 DIAGNOSIS — I248 Other forms of acute ischemic heart disease: Secondary | ICD-10-CM | POA: Diagnosis present

## 2014-06-30 DIAGNOSIS — R627 Adult failure to thrive: Secondary | ICD-10-CM | POA: Diagnosis present

## 2014-06-30 DIAGNOSIS — Z6825 Body mass index (BMI) 25.0-25.9, adult: Secondary | ICD-10-CM

## 2014-06-30 DIAGNOSIS — E870 Hyperosmolality and hypernatremia: Secondary | ICD-10-CM | POA: Diagnosis present

## 2014-06-30 DIAGNOSIS — Z881 Allergy status to other antibiotic agents status: Secondary | ICD-10-CM

## 2014-06-30 DIAGNOSIS — J90 Pleural effusion, not elsewhere classified: Secondary | ICD-10-CM | POA: Diagnosis present

## 2014-06-30 DIAGNOSIS — R6520 Severe sepsis without septic shock: Secondary | ICD-10-CM | POA: Diagnosis present

## 2014-06-30 DIAGNOSIS — Z515 Encounter for palliative care: Secondary | ICD-10-CM

## 2014-06-30 DIAGNOSIS — F0390 Unspecified dementia without behavioral disturbance: Secondary | ICD-10-CM | POA: Diagnosis present

## 2014-06-30 DIAGNOSIS — R4182 Altered mental status, unspecified: Secondary | ICD-10-CM | POA: Diagnosis present

## 2014-06-30 DIAGNOSIS — G9340 Encephalopathy, unspecified: Secondary | ICD-10-CM | POA: Diagnosis present

## 2014-06-30 DIAGNOSIS — S42202A Unspecified fracture of upper end of left humerus, initial encounter for closed fracture: Secondary | ICD-10-CM | POA: Diagnosis present

## 2014-06-30 DIAGNOSIS — N390 Urinary tract infection, site not specified: Secondary | ICD-10-CM

## 2014-06-30 DIAGNOSIS — D649 Anemia, unspecified: Secondary | ICD-10-CM | POA: Diagnosis present

## 2014-06-30 DIAGNOSIS — I10 Essential (primary) hypertension: Secondary | ICD-10-CM | POA: Diagnosis present

## 2014-06-30 DIAGNOSIS — N189 Chronic kidney disease, unspecified: Secondary | ICD-10-CM

## 2014-06-30 DIAGNOSIS — R410 Disorientation, unspecified: Secondary | ICD-10-CM

## 2014-06-30 DIAGNOSIS — T148XXA Other injury of unspecified body region, initial encounter: Secondary | ICD-10-CM

## 2014-06-30 LAB — URINE MICROSCOPIC-ADD ON

## 2014-06-30 LAB — CBC WITH DIFFERENTIAL/PLATELET
BASOS PCT: 0 % (ref 0–1)
Basophils Absolute: 0 10*3/uL (ref 0.0–0.1)
EOS PCT: 1 % (ref 0–5)
Eosinophils Absolute: 0.2 10*3/uL (ref 0.0–0.7)
HEMATOCRIT: 34.3 % — AB (ref 36.0–46.0)
HEMOGLOBIN: 11.2 g/dL — AB (ref 12.0–15.0)
Lymphocytes Relative: 12 % (ref 12–46)
Lymphs Abs: 1.5 10*3/uL (ref 0.7–4.0)
MCH: 30.3 pg (ref 26.0–34.0)
MCHC: 32.7 g/dL (ref 30.0–36.0)
MCV: 92.7 fL (ref 78.0–100.0)
MONO ABS: 1.1 10*3/uL — AB (ref 0.1–1.0)
MONOS PCT: 9 % (ref 3–12)
NEUTROS ABS: 9.7 10*3/uL — AB (ref 1.7–7.7)
Neutrophils Relative %: 78 % — ABNORMAL HIGH (ref 43–77)
Platelets: 264 10*3/uL (ref 150–400)
RBC: 3.7 MIL/uL — ABNORMAL LOW (ref 3.87–5.11)
RDW: 13 % (ref 11.5–15.5)
WBC: 12.4 10*3/uL — ABNORMAL HIGH (ref 4.0–10.5)

## 2014-06-30 LAB — COMPREHENSIVE METABOLIC PANEL
ALT: 9 U/L (ref 0–35)
ANION GAP: 16 — AB (ref 5–15)
AST: 21 U/L (ref 0–37)
Albumin: 3.6 g/dL (ref 3.5–5.2)
Alkaline Phosphatase: 66 U/L (ref 39–117)
BILIRUBIN TOTAL: 0.9 mg/dL (ref 0.3–1.2)
BUN: 31 mg/dL — ABNORMAL HIGH (ref 6–23)
CO2: 22 mEq/L (ref 19–32)
Calcium: 9.7 mg/dL (ref 8.4–10.5)
Chloride: 103 mEq/L (ref 96–112)
Creatinine, Ser: 1.45 mg/dL — ABNORMAL HIGH (ref 0.50–1.10)
GFR calc Af Amer: 33 mL/min — ABNORMAL LOW (ref >90)
GFR calc non Af Amer: 29 mL/min — ABNORMAL LOW (ref >90)
Glucose, Bld: 295 mg/dL — ABNORMAL HIGH (ref 70–99)
Potassium: 4.2 mEq/L (ref 3.7–5.3)
SODIUM: 141 meq/L (ref 137–147)
TOTAL PROTEIN: 7.1 g/dL (ref 6.0–8.3)

## 2014-06-30 LAB — CBC
HCT: 34.9 % — ABNORMAL LOW (ref 36.0–46.0)
Hemoglobin: 11.3 g/dL — ABNORMAL LOW (ref 12.0–15.0)
MCH: 29.5 pg (ref 26.0–34.0)
MCHC: 32.4 g/dL (ref 30.0–36.0)
MCV: 91.1 fL (ref 78.0–100.0)
Platelets: 275 10*3/uL (ref 150–400)
RBC: 3.83 MIL/uL — AB (ref 3.87–5.11)
RDW: 13.1 % (ref 11.5–15.5)
WBC: 13.4 10*3/uL — ABNORMAL HIGH (ref 4.0–10.5)

## 2014-06-30 LAB — URINALYSIS, ROUTINE W REFLEX MICROSCOPIC
Bilirubin Urine: NEGATIVE
GLUCOSE, UA: 500 mg/dL — AB
Ketones, ur: NEGATIVE mg/dL
NITRITE: NEGATIVE
PROTEIN: 100 mg/dL — AB
Specific Gravity, Urine: 1.013 (ref 1.005–1.030)
UROBILINOGEN UA: 0.2 mg/dL (ref 0.0–1.0)
pH: 6.5 (ref 5.0–8.0)

## 2014-06-30 LAB — BASIC METABOLIC PANEL
ANION GAP: 20 — AB (ref 5–15)
BUN: 31 mg/dL — ABNORMAL HIGH (ref 6–23)
CALCIUM: 9.4 mg/dL (ref 8.4–10.5)
CO2: 17 mEq/L — ABNORMAL LOW (ref 19–32)
Chloride: 104 mEq/L (ref 96–112)
Creatinine, Ser: 1.28 mg/dL — ABNORMAL HIGH (ref 0.50–1.10)
GFR calc Af Amer: 39 mL/min — ABNORMAL LOW (ref >90)
GFR calc non Af Amer: 33 mL/min — ABNORMAL LOW (ref >90)
Glucose, Bld: 360 mg/dL — ABNORMAL HIGH (ref 70–99)
Potassium: 4.7 mEq/L (ref 3.7–5.3)
SODIUM: 141 meq/L (ref 137–147)

## 2014-06-30 LAB — MRSA PCR SCREENING: MRSA BY PCR: NEGATIVE

## 2014-06-30 LAB — CBG MONITORING, ED: GLUCOSE-CAPILLARY: 269 mg/dL — AB (ref 70–99)

## 2014-06-30 LAB — PROTIME-INR
INR: 1.11 (ref 0.00–1.49)
Prothrombin Time: 14.4 seconds (ref 11.6–15.2)

## 2014-06-30 LAB — GLUCOSE, CAPILLARY: Glucose-Capillary: 272 mg/dL — ABNORMAL HIGH (ref 70–99)

## 2014-06-30 MED ORDER — CETYLPYRIDINIUM CHLORIDE 0.05 % MT LIQD
7.0000 mL | Freq: Two times a day (BID) | OROMUCOSAL | Status: DC
  Administered 2014-07-01 – 2014-07-03 (×5): 7 mL via OROMUCOSAL

## 2014-06-30 MED ORDER — SODIUM CHLORIDE 0.9 % IV SOLN
INTRAVENOUS | Status: DC
  Administered 2014-06-30 – 2014-07-01 (×3): via INTRAVENOUS

## 2014-06-30 MED ORDER — CHLORHEXIDINE GLUCONATE 0.12 % MT SOLN
15.0000 mL | Freq: Two times a day (BID) | OROMUCOSAL | Status: DC
  Administered 2014-06-30 – 2014-07-03 (×7): 15 mL via OROMUCOSAL
  Filled 2014-06-30 (×7): qty 15

## 2014-06-30 MED ORDER — ACETAMINOPHEN 650 MG RE SUPP
650.0000 mg | Freq: Once | RECTAL | Status: AC
  Administered 2014-06-30: 650 mg via RECTAL
  Filled 2014-06-30: qty 1

## 2014-06-30 MED ORDER — DEXTROSE 5 % IV SOLN
1.0000 g | Freq: Once | INTRAVENOUS | Status: AC
  Administered 2014-06-30: 1 g via INTRAVENOUS
  Filled 2014-06-30: qty 10

## 2014-06-30 MED ORDER — LEVOFLOXACIN IN D5W 500 MG/100ML IV SOLN
500.0000 mg | Freq: Once | INTRAVENOUS | Status: AC
  Administered 2014-06-30: 500 mg via INTRAVENOUS
  Filled 2014-06-30: qty 100

## 2014-06-30 MED ORDER — CEPHALEXIN 500 MG PO CAPS
500.0000 mg | ORAL_CAPSULE | Freq: Two times a day (BID) | ORAL | Status: DC
  Administered 2014-06-30: 500 mg via ORAL
  Filled 2014-06-30: qty 1

## 2014-06-30 MED ORDER — INSULIN ASPART 100 UNIT/ML ~~LOC~~ SOLN: 0.0000 [IU] | Freq: Three times a day (TID) | SUBCUTANEOUS | Status: DC

## 2014-06-30 MED ORDER — SODIUM CHLORIDE 0.9 % IV BOLUS (SEPSIS)
1000.0000 mL | Freq: Once | INTRAVENOUS | Status: AC
Start: 2014-06-30 — End: 2014-06-30
  Administered 2014-06-30: 1000 mL via INTRAVENOUS

## 2014-06-30 MED ORDER — ACETAMINOPHEN 650 MG RE SUPP: 325.0000 mg | RECTAL | Status: DC | PRN

## 2014-06-30 MED ORDER — ACETAMINOPHEN 500 MG PO TABS: 500.0000 mg | ORAL_TABLET | Freq: Four times a day (QID) | ORAL | Status: DC | PRN

## 2014-06-30 MED ORDER — CEPHALEXIN 500 MG PO CAPS: 500.0000 mg | ORAL_CAPSULE | Freq: Two times a day (BID) | ORAL | Status: DC

## 2014-06-30 MED ORDER — SODIUM CHLORIDE 0.9 % IJ SOLN
3.0000 mL | Freq: Two times a day (BID) | INTRAMUSCULAR | Status: DC
  Administered 2014-06-30 – 2014-07-04 (×6): 3 mL via INTRAVENOUS

## 2014-06-30 MED ORDER — INSULIN ASPART 100 UNIT/ML ~~LOC~~ SOLN
0.0000 [IU] | SUBCUTANEOUS | Status: DC
  Administered 2014-07-01: 5 [IU] via SUBCUTANEOUS
  Administered 2014-07-01: 1 [IU] via SUBCUTANEOUS

## 2014-06-30 MED ORDER — SODIUM CHLORIDE 0.9 % IV BOLUS (SEPSIS): 500.0000 mL | Freq: Once | INTRAVENOUS | Status: DC

## 2014-06-30 MED ORDER — LEVOFLOXACIN IN D5W 250 MG/50ML IV SOLN: 250.0000 mg | INTRAVENOUS | Status: DC

## 2014-06-30 MED ORDER — ACETAMINOPHEN 500 MG PO TABS
1000.0000 mg | ORAL_TABLET | Freq: Once | ORAL | Status: DC
  Filled 2014-06-30: qty 2

## 2014-06-30 MED ORDER — LORAZEPAM 1 MG PO TABS
1.0000 mg | ORAL_TABLET | Freq: Once | ORAL | Status: AC
  Administered 2014-06-30: 1 mg via ORAL
  Filled 2014-06-30: qty 2

## 2014-06-30 NOTE — ED Notes (Signed)
[facility]- Pt coming from home, she was seen and treated at [facility] this morning for UTI with confusion, pt has multiple skin tears, bruising and a hematoma to left side of the forehead. Pt is disoriented on arrival with inappropriate answers.

## 2014-06-30 NOTE — ED Notes (Signed)
Bed: RESA Expected date:  Expected time:  Means of arrival:  Comments: EMS 40F decreased mental status from home

## 2014-06-30 NOTE — ED Notes (Signed)
MD at bedside to discuss pt condition with family.

## 2014-06-30 NOTE — ED Notes (Signed)
Granddaughter awaiting to speak with Education officer, museum.

## 2014-06-30 NOTE — ED Notes (Signed)
Attempted to have patient take Tylenol PO; pt will not drink from cup. Order changed to rectal
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
## 2014-06-30 NOTE — Discharge Instructions (Signed)
Give antibiotic as prescribed.  Follow-up with your doctor as arranged.  Return to the emergency part for worsening condition or new concerning symptoms.   Altered Mental Status Altered mental status most often refers to an abnormal change in your responsiveness and awareness. It can affect your speech, thought, mobility, memory, attention span, or alertness. It can range from slight confusion to complete unresponsiveness (coma). Altered mental status can be a sign of a serious underlying medical condition. Rapid evaluation and medical treatment is necessary for patients having an altered mental status. CAUSES   Low blood sugar (hypoglycemia) or diabetes.  Severe loss of body fluids (dehydration) or a body salt (electrolyte) imbalance.  A stroke or other neurologic problem, such as dementia or delirium.  A head injury or tumor.  A drug or alcohol overdose.  Exposure to toxins or poisons.  Depression, anxiety, and stress.  A low oxygen level (hypoxia).  An infection.  Blood loss.  Twitching or shaking (seizure).  Heart problems, such as heart attack or heart rhythm problems (arrhythmias).  A body temperature that is too low or too high (hypothermia or hyperthermia). DIAGNOSIS  A diagnosis is based on your history, symptoms, physical and neurologic examinations, and diagnostic tests. Diagnostic tests may include:  Measurement of your blood pressure, pulse, breathing, and oxygen levels (vital signs).  Blood tests.  Urine tests.  X-ray exams.  A computerized magnetic scan (magnetic resonance imaging, MRI).  A computerized X-ray scan (computed tomography, CT scan). TREATMENT  Treatment will depend on the cause. Treatment may include:  Management of an underlying medical or mental health condition.  Critical care or support in the hospital. [place]   Only take over-the-counter or prescription medicines for pain, discomfort, or fever as directed by your  caregiver.  Manage underlying conditions as directed by your caregiver.  Eat a healthy, well-balanced diet to maintain strength.  Join a support group or prevention program to cope with the condition or trauma that caused the altered mental status. Ask your caregiver to help choose a program that works for you.  Follow up with your caregiver for further examination, therapy, or testing as directed. SEEK MEDICAL CARE IF:   You feel unwell or have chills.  You or your family notice a change in your behavior or your alertness.  You have trouble following your caregiver's treatment plan.  You have questions or concerns. SEEK IMMEDIATE MEDICAL CARE IF:   You have a rapid heartbeat or have chest pain.  You have difficulty breathing.  You have a fever.  You have a headache with a stiff neck.  You cough up blood.  You have blood in your urine or stool.  You have severe agitation or confusion. MAKE SURE YOU:   Understand these instructions.  Will watch your condition.  Will get help right away if you are not doing well or get worse. Document Released: 01/04/2010 Document Revised: 10/09/2011 Document Reviewed: 01/04/2010 [facility] Patient Information 2015 [place], [place]. This information is not intended to replace advice given to you by your health care provider. Make sure you discuss any questions you have with your health care provider.  Urinary Tract Infection Urinary tract infections (UTIs) can develop anywhere along your urinary tract. Your urinary tract is your body's drainage system for removing wastes and extra water. Your urinary tract includes two kidneys, two ureters, a bladder, and a urethra. Your kidneys are a pair of bean-shaped organs. Each kidney is about the size of your fist. They are  located below your ribs, one on each side of your spine. CAUSES Infections are caused by microbes, which are microscopic organisms, including fungi, viruses, and bacteria. These  organisms are so small that they can only be seen through a microscope. Bacteria are the microbes that most commonly cause UTIs. SYMPTOMS  Symptoms of UTIs may vary by age and gender of the patient and by the location of the infection. Symptoms in young women typically include a frequent and intense urge to urinate and a painful, burning feeling in the bladder or urethra during urination. Older women and men are more likely to be tired, shaky, and weak and have muscle aches and abdominal pain. A fever may mean the infection is in your kidneys. Other symptoms of a kidney infection include pain in your back or sides below the ribs, nausea, and vomiting. DIAGNOSIS To diagnose a UTI, your caregiver will ask you about your symptoms. Your caregiver also will ask to provide a urine sample. The urine sample will be tested for bacteria and white blood cells. White blood cells are made by your body to help fight infection. TREATMENT  Typically, UTIs can be treated with medication. Because most UTIs are caused by a bacterial infection, they usually can be treated with the use of antibiotics. The choice of antibiotic and length of treatment depend on your symptoms and the type of bacteria causing your infection. HOME CARE INSTRUCTIONS  If you were prescribed antibiotics, take them exactly as your caregiver instructs you. Finish the medication even if you feel better after you have only taken some of the medication.  Drink enough water and fluids to keep your urine clear or pale yellow.  Avoid caffeine, tea, and carbonated beverages. They tend to irritate your bladder.  Empty your bladder often. Avoid holding urine for long periods of time.  Empty your bladder before and after sexual intercourse.  After a bowel movement, women should cleanse from front to back. Use each tissue only once. SEEK MEDICAL CARE IF:   You have back pain.  You develop a fever.  Your symptoms do not begin to resolve within 3  days. SEEK IMMEDIATE MEDICAL CARE IF:   You have severe back pain or lower abdominal pain.  You develop chills.  You have nausea or vomiting.  You have continued burning or discomfort with urination. MAKE SURE YOU:   Understand these instructions.  Will watch your condition.  Will get help right away if you are not doing well or get worse. Document Released: 04/26/2005 Document Revised: 01/16/2012 Document Reviewed: 08/25/2011 [facility] Patient Information 2015 [place], [place]. This information is not intended to replace advice given to you by your health care provider. Make sure you discuss any questions you have with your health care provider.
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
## 2014-06-30 NOTE — ED Notes (Signed)
CBG completed per family request. Pt asleep and resting; respirations even and unlabored

## 2014-06-30 NOTE — Consult Note (Signed)
ORTHOPAEDIC CONSULTATION  REQUESTING PHYSICIAN: Madilyn Fireman, MD  Chief Complaint: left proximal humerus fracture  HPI: Molly Ashley is a 80 y.o. female who is non-verbal and demented. She was admitted after a fall that occurred last week and found to have a left proximal humerus fracture. Her grandaughter states that she has fallen a few times and bruises easily  Past Medical History  Diagnosis Date  . Arthritis   . Cancer   . Diabetes mellitus without complication   . Cataract    Past Surgical History  Procedure Laterality Date  . Abdominal hysterectomy    . Eye surgery     History   Social History  . Marital Status: Widowed    Spouse Name: N/A    Number of Children: N/A  . Years of Education: N/A   Social History Main Topics  . Smoking status: Never Smoker   . Smokeless tobacco: Never Used  . Alcohol Use: No  . Drug Use: No  . Sexual Activity: None   Other Topics Concern  . None   Social History Narrative   History reviewed. No pertinent family history. Allergies  Allergen Reactions  . Cephalexin Other (See Comments)    Confusion/Altered mental status per family    Prior to Admission medications   Medication Sig Start Date End Date Taking? Authorizing Provider  acetaminophen (TYLENOL) 500 MG tablet Take 500 mg by mouth every 6 (six) hours as needed (for pain.).    Historical Provider, MD  cephALEXin (KEFLEX) 500 MG capsule Take 1 capsule (500 mg total) by mouth every 12 (twelve) hours. 06/30/14   Kalman Drape, MD  donepezil (ARICEPT) 5 MG tablet Take 5 mg by mouth at bedtime.  06/02/14   Historical Provider, MD  glipiZIDE (GLUCOTROL) 5 MG tablet Take 5 mg by mouth daily.  03/23/13   Historical Provider, MD  Polyethyl Glycol-Propyl Glycol (SYSTANE OP) Apply 1 drop to eye as needed (dry eyes).    Historical Provider, MD  propranolol (INDERAL) 60 MG tablet Take 30 mg by mouth 2 (two) times daily.    Historical Provider, MD  simvastatin (ZOCOR) 10 MG  tablet Take 10 mg by mouth at bedtime.    Historical Provider, MD  sitaGLIPtin (JANUVIA) 25 MG tablet Take 25 mg by mouth every other day.     Historical Provider, MD   Dg Forearm Left  06/30/2014   CLINICAL DATA:  Arm bruising. Fluid collection on the ulnar aspect of the forearm. Subsequent encounter.  EXAM: LEFT FOREARM - 2 VIEW  COMPARISON:  06/27/2014.  FINDINGS: The radius and ulna appear normal. There is diffuse subcutaneous edema extending from above the elbow through the forearm. Old ulnar styloid avulsion. Osteoarthritis of the wrist.  IMPRESSION: Soft tissue edema.  No osseous injury.   Electronically Signed   By: Dereck Ligas M.D.   On: 06/30/2014 14:59   Dg Humerus Left  06/30/2014   CLINICAL DATA:  Bruising about the left shoulder and upper arm. Pain. The patient is demented and unable to provide further history. Initial encounter.  EXAM: LEFT HUMERUS - 2+ VIEW  COMPARISON:  None.  FINDINGS: There is surgical neck fracture of the left humerus with 1 shaft with anterior displacement and impaction. The humeral head is located. There is acromioclavicular degenerative change and calcific rotator cuff tendinopathy.  IMPRESSION: Acute and impacted and anteriorly displaced surgical neck fracture left humerus.   Electronically Signed   By: Inge Rise M.D.  On: 06/30/2014 14:58    Positive ROS: All other systems have been reviewed and were otherwise negative with the exception of those mentioned in the HPI and as above.  Labs cbc  Recent Labs  06/30/14 0241 06/30/14 1300  WBC 12.4* 13.4*  HGB 11.2* 11.3*  HCT 34.3* 34.9*  PLT 264 275    Labs inflam No results for input(s): CRP in the last 72 hours.  Invalid input(s): ESR  Labs coag  Recent Labs  06/30/14 0241  INR 1.11     Recent Labs  06/30/14 0241 06/30/14 1300  NA 141 141  K 4.2 4.7  CL 103 104  CO2 22 17*  GLUCOSE 295* 360*  BUN 31* 31*  CREATININE 1.45* 1.28*  CALCIUM 9.7 9.4    Physical  Exam: Filed Vitals:   06/30/14 1816  BP: 105/79  Pulse: 109  Temp:   Resp: 21   General: Alert, no acute distress Cardiovascular: No pedal edema Respiratory: No cyanosis, no use of accessory musculature GI: No organomegaly, abdomen is soft and non-tender Skin: No lesions in the area of chief complaint other than those listed below in MSK exam.  Neurologic: Sensation intact distally Psychiatric: She is demented Lymphatic: No axillary or cervical lymphadenopathy  MUSCULOSKELETAL:  LUE: pain at the proximal humerus, fingers are WWP. No other crepitous Other extremities show multiple echymotic areas with no obvious crepitous. Good pulses to all extremities  Assessment: Left proximal humerus fx  Plan: NON-operative treamtent Sling for comfort but does not need to be on full time.  PT VTE px: SCD's and no orthopaedic contraindication to chemical px.  I willl sign off at this time, f/u with me in 2-4wks in clinic   Edmonia Lynch, D, MD Cell [phone]   06/30/2014 7:03 PM

## 2014-06-30 NOTE — ED Notes (Signed)
Patient family refusing blood pressures, "it's hurting her arm, she has bruises there". MD notified.

## 2014-06-30 NOTE — ED Notes (Signed)
Teaching services at bedside. 

## 2014-06-30 NOTE — Progress Notes (Signed)
CSW spoke with pt's granddaughter and great-granddaughter re: NHP for pt.  Family agreeable to d/c plan and will be followed by social work on the floor.  Full assessment to follow.

## 2014-06-30 NOTE — ED Notes (Signed)
Jody CSW at bedside

## 2014-06-30 NOTE — ED Notes (Signed)
Family informed that social work will speak to family when admitted to floor

## 2014-06-30 NOTE — Progress Notes (Signed)
Orthopedic Tech Progress Note Patient Details:  Molly Ashley [birth year] [id number]  Ortho Devices Type of Ortho Device: Sling immobilizer Ortho Device/Splint Location: LUE Ortho Device/Splint Interventions: Ordered, Application   Braulio Bosch 06/30/2014, 3:51 PM

## 2014-06-30 NOTE — ED Notes (Signed)
MD at bedside. 

## 2014-06-30 NOTE — ED Notes (Signed)
Pt arrives by EMS from home. Pt presents with c/o decreased mental status over the last couple of days and has gotten progressively worse over the last couple of hours. Pt was recently discharged from [facility] after a fall. VS 160 palpated, HR 80.

## 2014-06-30 NOTE — ED Notes (Signed)
Family called out reporting something was wrong with pt. On assessment, pt was shaking in the bed and grasping siderail. Pt tachycardic at that time.
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
## 2014-06-30 NOTE — Progress Notes (Signed)
ANTIBIOTIC CONSULT NOTE - INITIAL  Pharmacy Consult for levaquin Indication: UTI  Allergies  Allergen Reactions  . Cephalexin Other (See Comments)    Confusion/Altered mental status per family     Patient Measurements:   Adjusted Body Weight:   Vital Signs: Temp: 98.7 F (37.1 C) (12/01 1715) Temp Source: Temporal (12/01 1715) BP: 105/79 mmHg (12/01 1816) Pulse Rate: 109 (12/01 1816) Intake/Output from previous day:   Intake/Output from this shift: Total I/O In: 1000 [I.V.:1000] Out: -   Labs:  Recent Labs  06/30/14 0241 06/30/14 1300  WBC 12.4* 13.4*  HGB 11.2* 11.3*  PLT 264 275  CREATININE 1.45* 1.28*   Estimated Creatinine Clearance: 16.9 mL/min (by C-G formula based on Cr of 1.28). No results for input(s): VANCOTROUGH, VANCOPEAK, VANCORANDOM, GENTTROUGH, GENTPEAK, GENTRANDOM, TOBRATROUGH, TOBRAPEAK, TOBRARND, AMIKACINPEAK, AMIKACINTROU, AMIKACIN in the last 72 hours.   Microbiology: No results found for this or any previous visit (from the past 720 hour(s)).  Medical History: Past Medical History  Diagnosis Date  . Arthritis   . Cancer   . Diabetes mellitus without complication   . Cataract     Medications:  Scheduled:  . [START ON 07/01/2014] insulin aspart  0-9 Units Subcutaneous TID WC   Infusions:  . sodium chloride 125 mL/hr at 06/30/14 1717   Assessment: 80 yo female with UTI will be started on levaquin.  CrCl ~17.    Goal of Therapy:  Resolution of infection  Plan:  Levaquin 500 mg iv x1, then 250 mg iv q48h  Novalyn Lajara, Tsz-Yin 06/30/2014,6:54 PM

## 2014-06-30 NOTE — ED Provider Notes (Addendum)
CSN: [id number]     Arrival date & time 06/30/14  0106 History   First MD Initiated Contact with Patient 06/30/14 0124     Chief Complaint  Patient presents with  . Altered Mental Status     (Consider location/radiation/quality/duration/timing/severity/associated sxs/prior Treatment) HPI 80 year old female presents to emergency department via EMS from home due to mental status.  Family reports over the last several days she has been more confused, talks nonstop and is not sleeping.  They report they have noticed a change in her behavior over the past year, but it is worsened over the last week.  Patient had a fall 6 days ago striking her head and shoulder, denies any pain to these areas now, residual bruising noted.  They report she is sleeping only a few hours during the day, and then is up, restless, and wandering throughout the night. Past Medical History  Diagnosis Date  . Arthritis   . Cancer   . Diabetes mellitus without complication   . Cataract    Past Surgical History  Procedure Laterality Date  . Abdominal hysterectomy    . Eye surgery     History reviewed. No pertinent family history. History  Substance Use Topics  . Smoking status: Never Smoker   . Smokeless tobacco: Never Used  . Alcohol Use: No   OB History    No data available     Review of Systems  Unable to perform ROS: Mental status change      Allergies  Review of patient's allergies indicates no known allergies.  Home Medications   Prior to Admission medications   Medication Sig Start Date End Date Taking? Authorizing Provider  acetaminophen (TYLENOL) 500 MG tablet Take 500 mg by mouth every 6 (six) hours as needed (for pain.).   Yes Historical Provider, MD  glipiZIDE (GLUCOTROL) 5 MG tablet Take 5 mg by mouth daily.  03/23/13  Yes Historical Provider, MD  propranolol (INDERAL) 60 MG tablet Take 30 mg by mouth 2 (two) times daily.   Yes Historical Provider, MD  simvastatin (ZOCOR) 10 MG tablet  Take 10 mg by mouth at bedtime.   Yes Historical Provider, MD  amoxicillin-clavulanate (AUGMENTIN) 875-125 MG per tablet Take 1 tablet by mouth every 12 (twelve) hours. Patient not taking: Reported on 06/24/2014 10/17/13   Glendell Docker, NP  donepezil (ARICEPT) 5 MG tablet Take 5 mg by mouth at bedtime.  06/02/14   Historical Provider, MD  Polyethyl Glycol-Propyl Glycol (SYSTANE OP) Apply 1 drop to eye as needed (dry eyes).    Historical Provider, MD  sitaGLIPtin (JANUVIA) 25 MG tablet Take 25 mg by mouth every other day.     Historical Provider, MD  traMADol (ULTRAM) 50 MG tablet Take 1 tablet (50 mg total) by mouth every 6 (six) hours as needed. Patient not taking: Reported on 06/30/2014 06/27/14   Virgel Manifold, MD  traMADol (ULTRAM) 50 MG tablet Take 1 tablet (50 mg total) by mouth every 12 (twelve) hours as needed. Patient not taking: Reported on 06/30/2014 06/27/14   Virgel Manifold, MD   BP 168/115 mmHg  Pulse 75  Temp(Src) 99.1 F (37.3 C) (Oral)  Resp 15  SpO2 100% Physical Exam  Constitutional: She is oriented to person, place, and time.  Frail, elderly female, no acute distress  HENT:  Head: Normocephalic.  Right Ear: External ear normal.  Left Ear: External ear normal.  Nose: Nose normal.  Mouth/Throat: Oropharynx is clear and moist. No oropharyngeal exudate.  Bruising noted  to left side of face  Eyes: Conjunctivae and EOM are normal. Pupils are equal, round, and reactive to light. Right eye exhibits discharge. Left eye exhibits discharge.  Neck: Normal range of motion. Neck supple. No JVD present. No tracheal deviation present. No thyromegaly present.  Cardiovascular: Normal rate, regular rhythm, normal heart sounds and intact distal pulses.  Exam reveals no gallop and no friction rub.   No murmur heard. Pulmonary/Chest: Effort normal and breath sounds normal. No stridor. No respiratory distress. She has no wheezes. She has no rales. She exhibits no tenderness.  Abdominal:  Soft. Bowel sounds are normal. She exhibits no distension and no mass. There is no tenderness. There is no rebound and no guarding.  Musculoskeletal: Normal range of motion. She exhibits no edema or tenderness.  Extensive bruising to right upper arm, and entire left arm with bluish hue.  Pulses are normal.  Sensation is normal.  Left arm is warmer than the right  Lymphadenopathy:    She has no cervical adenopathy.  Neurological: She is alert and oriented to person, place, and time. She displays normal reflexes. No cranial nerve deficit. She exhibits normal muscle tone. Coordination normal.  Skin: Skin is warm and dry. No rash noted. No erythema. No pallor.  Psychiatric:  Patient with pressured speech, stream of consciousness   Nursing note and vitals reviewed.   ED Course  Procedures (including critical care time) Labs Review Labs Reviewed  COMPREHENSIVE METABOLIC PANEL - Abnormal; Notable for the following:    Glucose, Bld 295 (*)    BUN 31 (*)    Creatinine, Ser 1.45 (*)    GFR calc non Af Amer 29 (*)    GFR calc Af Amer 33 (*)    Anion gap 16 (*)    All other components within normal limits  CBC WITH DIFFERENTIAL - Abnormal; Notable for the following:    WBC 12.4 (*)    RBC 3.70 (*)    Hemoglobin 11.2 (*)    HCT 34.3 (*)    Neutrophils Relative % 78 (*)    Neutro Abs 9.7 (*)    Monocytes Absolute 1.1 (*)    All other components within normal limits  URINALYSIS, ROUTINE W REFLEX MICROSCOPIC - Abnormal; Notable for the following:    APPearance CLOUDY (*)    Glucose, UA 500 (*)    Hgb urine dipstick MODERATE (*)    Protein, ur 100 (*)    Leukocytes, UA TRACE (*)    All other components within normal limits  URINE MICROSCOPIC-ADD ON - Abnormal; Notable for the following:    Squamous Epithelial / LPF FEW (*)    Bacteria, UA MANY (*)    Casts HYALINE CASTS (*)    All other components within normal limits  URINE CULTURE  PROTIME-INR    Imaging Review No results  found.   EKG Interpretation None      MDM   Final diagnoses:  UTI (lower urinary tract infection)  Confusion associated with infection    80 year old female with change in mental status.  Labs and urine ordered.    UTI noted.  Will start on keflex for same.  Mild elevation in wbc, stable renal disease.  No n/v, taking orals well per family.  Afebrile.  Family reports they have f/u with pcm scheduled.  Pt given small dose of ativan here to help with sleep/wake cycle.  Family updated on findings, plan, and reasons for return.  Kalman Drape, MD 06/30/14 [phone]  Kalman Drape, MD 06/30/14 [phone]

## 2014-07-01 DIAGNOSIS — E872 Acidosis: Secondary | ICD-10-CM

## 2014-07-01 DIAGNOSIS — E785 Hyperlipidemia, unspecified: Secondary | ICD-10-CM

## 2014-07-01 DIAGNOSIS — G9340 Encephalopathy, unspecified: Secondary | ICD-10-CM

## 2014-07-01 DIAGNOSIS — R296 Repeated falls: Secondary | ICD-10-CM

## 2014-07-01 DIAGNOSIS — N390 Urinary tract infection, site not specified: Secondary | ICD-10-CM

## 2014-07-01 DIAGNOSIS — S42302A Unspecified fracture of shaft of humerus, left arm, initial encounter for closed fracture: Secondary | ICD-10-CM

## 2014-07-01 DIAGNOSIS — D649 Anemia, unspecified: Secondary | ICD-10-CM

## 2014-07-01 DIAGNOSIS — T148 Other injury of unspecified body region: Secondary | ICD-10-CM

## 2014-07-01 DIAGNOSIS — T148XXA Other injury of unspecified body region, initial encounter: Secondary | ICD-10-CM | POA: Insufficient documentation

## 2014-07-01 DIAGNOSIS — N179 Acute kidney failure, unspecified: Secondary | ICD-10-CM

## 2014-07-01 DIAGNOSIS — E119 Type 2 diabetes mellitus without complications: Secondary | ICD-10-CM

## 2014-07-01 DIAGNOSIS — N3000 Acute cystitis without hematuria: Secondary | ICD-10-CM | POA: Insufficient documentation

## 2014-07-01 DIAGNOSIS — F0390 Unspecified dementia without behavioral disturbance: Secondary | ICD-10-CM

## 2014-07-01 DIAGNOSIS — A419 Sepsis, unspecified organism: Principal | ICD-10-CM

## 2014-07-01 LAB — GLUCOSE, CAPILLARY
GLUCOSE-CAPILLARY: 121 mg/dL — AB (ref 70–99)
Glucose-Capillary: 136 mg/dL — ABNORMAL HIGH (ref 70–99)
Glucose-Capillary: 152 mg/dL — ABNORMAL HIGH (ref 70–99)
Glucose-Capillary: 263 mg/dL — ABNORMAL HIGH (ref 70–99)

## 2014-07-01 LAB — HEPATIC FUNCTION PANEL
ALBUMIN: 2.4 g/dL — AB (ref 3.5–5.2)
ALT: 10 U/L (ref 0–35)
AST: 30 U/L (ref 0–37)
Alkaline Phosphatase: 43 U/L (ref 39–117)
Bilirubin, Direct: <0.2 mg/dL (ref 0.0–0.3)
Total Bilirubin: 0.5 mg/dL (ref 0.3–1.2)
Total Protein: 5 g/dL — ABNORMAL LOW (ref 6.0–8.3)

## 2014-07-01 LAB — LACTIC ACID, PLASMA
LACTIC ACID, VENOUS: 1.4 mmol/L (ref 0.5–2.2)
Lactic Acid, Venous: 1.9 mmol/L (ref 0.5–2.2)

## 2014-07-01 LAB — CBC
HEMATOCRIT: 26.1 % — AB (ref 36.0–46.0)
HEMOGLOBIN: 8.6 g/dL — AB (ref 12.0–15.0)
MCH: 30.2 pg (ref 26.0–34.0)
MCHC: 33 g/dL (ref 30.0–36.0)
MCV: 91.6 fL (ref 78.0–100.0)
Platelets: 212 10*3/uL (ref 150–400)
RBC: 2.85 MIL/uL — ABNORMAL LOW (ref 3.87–5.11)
RDW: 13.6 % (ref 11.5–15.5)
WBC: 13 10*3/uL — ABNORMAL HIGH (ref 4.0–10.5)

## 2014-07-01 LAB — URINE CULTURE

## 2014-07-01 LAB — TROPONIN I
Troponin I: 1.86 ng/mL (ref <0.30)
Troponin I: 2.12 ng/mL (ref <0.30)
Troponin I: 1.14 ng/mL (ref <0.30)

## 2014-07-01 LAB — BASIC METABOLIC PANEL
Anion gap: 13 (ref 5–15)
BUN: 23 mg/dL (ref 6–23)
CHLORIDE: 118 meq/L — AB (ref 96–112)
CO2: 18 meq/L — AB (ref 19–32)
CREATININE: 1.21 mg/dL — AB (ref 0.50–1.10)
Calcium: 7.7 mg/dL — ABNORMAL LOW (ref 8.4–10.5)
GFR calc non Af Amer: 36 mL/min — ABNORMAL LOW (ref >90)
GFR, EST AFRICAN AMERICAN: 41 mL/min — AB (ref >90)
Glucose, Bld: 147 mg/dL — ABNORMAL HIGH (ref 70–99)
Potassium: 3.5 mEq/L — ABNORMAL LOW (ref 3.7–5.3)
Sodium: 149 mEq/L — ABNORMAL HIGH (ref 137–147)

## 2014-07-01 LAB — CK: CK TOTAL: 453 U/L — AB (ref 7–177)

## 2014-07-01 LAB — COMPREHENSIVE METABOLIC PANEL
AST: 15 U/L (ref 0–37)
Albumin: 0.9 g/dL — ABNORMAL LOW (ref 3.5–5.2)
Alkaline Phosphatase: 10 U/L — ABNORMAL LOW (ref 39–117)
Anion gap: 8 (ref 5–15)
BILIRUBIN TOTAL: 6.4 mg/dL — AB (ref 0.3–1.2)
BUN: 27 mg/dL — ABNORMAL HIGH (ref 6–23)
CHLORIDE: 121 meq/L — AB (ref 96–112)
CO2: 19 meq/L (ref 19–32)
CREATININE: 1.21 mg/dL — AB (ref 0.50–1.10)
Calcium: 5.9 mg/dL — CL (ref 8.4–10.5)
GFR calc Af Amer: 41 mL/min — ABNORMAL LOW (ref >90)
GFR calc non Af Amer: 36 mL/min — ABNORMAL LOW (ref >90)
Glucose, Bld: 120 mg/dL — ABNORMAL HIGH (ref 70–99)
Potassium: 3.8 mEq/L (ref 3.7–5.3)
Sodium: 148 mEq/L — ABNORMAL HIGH (ref 137–147)
Total Protein: 1.2 g/dL — ABNORMAL LOW (ref 6.0–8.3)

## 2014-07-01 LAB — HEMOGLOBIN A1C
HEMOGLOBIN A1C: 7.3 % — AB (ref <5.7)
Mean Plasma Glucose: 163 mg/dL — ABNORMAL HIGH (ref <117)

## 2014-07-01 LAB — SALICYLATE LEVEL

## 2014-07-01 LAB — ACETAMINOPHEN LEVEL

## 2014-07-01 LAB — OSMOLALITY: Osmolality: 314 mOsm/kg — ABNORMAL HIGH (ref 275–300)

## 2014-07-01 LAB — TSH: TSH: 1.01 u[IU]/mL (ref 0.350–4.500)

## 2014-07-01 MED ORDER — SODIUM CHLORIDE 0.9 % IV SOLN
INTRAVENOUS | Status: DC
  Administered 2014-07-01: 11:00:00 via INTRAVENOUS

## 2014-07-01 MED ORDER — VANCOMYCIN HCL IN DEXTROSE 750-5 MG/150ML-% IV SOLN: 750.0000 mg | INTRAVENOUS | Status: DC

## 2014-07-01 MED ORDER — LACTATED RINGERS IV SOLN
INTRAVENOUS | Status: DC
  Administered 2014-07-02: 75 mL/h via INTRAVENOUS
  Administered 2014-07-03: 06:00:00 via INTRAVENOUS

## 2014-07-01 MED ORDER — VANCOMYCIN HCL IN DEXTROSE 1-5 GM/200ML-% IV SOLN
1000.0000 mg | Freq: Once | INTRAVENOUS | Status: AC
  Administered 2014-07-01: 1000 mg via INTRAVENOUS
  Filled 2014-07-01: qty 200

## 2014-07-01 MED ORDER — MORPHINE SULFATE 2 MG/ML IJ SOLN: 1.0000 mg | INTRAMUSCULAR | Status: DC | PRN

## 2014-07-01 MED ORDER — SODIUM CHLORIDE 0.9 % IV SOLN
INTRAVENOUS | Status: DC
  Administered 2014-07-01: 12:00:00 via INTRAVENOUS

## 2014-07-01 MED ORDER — SODIUM CHLORIDE 0.9 % IV BOLUS (SEPSIS)
1000.0000 mL | Freq: Once | INTRAVENOUS | Status: AC
  Administered 2014-07-01: 1000 mL via INTRAVENOUS

## 2014-07-01 MED ORDER — SODIUM CHLORIDE 0.9 % IV BOLUS (SEPSIS): 1000.0000 mL | Freq: Once | INTRAVENOUS | Status: DC

## 2014-07-01 MED ORDER — MORPHINE SULFATE 2 MG/ML IJ SOLN
2.0000 mg | INTRAMUSCULAR | Status: DC | PRN
Start: 2014-07-01 — End: 2014-07-01

## 2014-07-01 MED ORDER — PIPERACILLIN-TAZOBACTAM IN DEX 2-0.25 GM/50ML IV SOLN
2.2500 g | Freq: Four times a day (QID) | INTRAVENOUS | Status: DC
  Administered 2014-07-01 – 2014-07-03 (×10): 2.25 g via INTRAVENOUS
  Filled 2014-07-01 (×13): qty 50

## 2014-07-01 MED ORDER — VANCOMYCIN HCL IN DEXTROSE 1-5 GM/200ML-% IV SOLN
1000.0000 mg | INTRAVENOUS | Status: DC
  Administered 2014-07-03: 1000 mg via INTRAVENOUS
  Filled 2014-07-01: qty 200
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
## 2014-07-01 NOTE — Progress Notes (Signed)
ANTIBIOTIC CONSULT NOTE - INITIAL  Pharmacy Consult for Vancomycin and Zosyn  Indication: rule out sepsis  Allergies  Allergen Reactions  . Cephalexin Other (See Comments)    Confusion/Altered mental status per family     Patient Measurements: Height: 5\' 2"  (157.5 cm) Weight: 119 lb 7.8 oz (54.2 kg) IBW/kg (Calculated) : 50.1  Vital Signs: Temp: 98 F (36.7 C) (12/01 2348) Temp Source: Oral (12/01 2348) BP: 98/40 mmHg (12/02 0200) Pulse Rate: 74 (12/02 0200) Intake/Output from previous day: 12/01 0701 - 12/02 0700 In: 2475 [I.V.:1375; IV Piggyback:1100] Out: -  Intake/Output from this shift: Total I/O In: 1475 [I.V.:375; IV Piggyback:1100] Out: -   Labs:  Recent Labs  06/30/14 0241 06/30/14 1300  WBC 12.4* 13.4*  HGB 11.2* 11.3*  PLT 264 275  CREATININE 1.45* 1.28*   Estimated Creatinine Clearance: 18.9 mL/min (by C-G formula based on Cr of 1.28). No results for input(s): VANCOTROUGH, VANCOPEAK, VANCORANDOM, GENTTROUGH, GENTPEAK, GENTRANDOM, TOBRATROUGH, TOBRAPEAK, TOBRARND, AMIKACINPEAK, AMIKACINTROU, AMIKACIN in the last 72 hours.   Microbiology: Recent Results (from the past 720 hour(s))  MRSA PCR Screening     Status: None   Collection Time: 06/30/14  7:16 PM  Result Value Ref Range Status   MRSA by PCR NEGATIVE NEGATIVE Final    Comment:        The GeneXpert MRSA Assay (FDA approved for NASAL specimens only), is one component of a comprehensive MRSA colonization surveillance program. It is not intended to diagnose MRSA infection nor to guide or monitor treatment for MRSA infections.    Assessment: 80 yo female with hypotension, possible sepsis, for empiric antibiotics  Goal of Therapy:  Vancomycin trough level 15-20 mcg/ml  Plan:  Vancomycin 1 g IV now, then 750 mg IV q48h Zosyn 2.25 g IV q6h  Noga Fogg, Bronson Curb 07/01/2014,2:41 AM

## 2014-07-01 NOTE — Plan of Care (Signed)
Problem: Phase I Progression Outcomes Goal: Pain controlled with appropriate interventions Outcome: Progressing Goal: OOB as tolerated unless otherwise ordered Outcome: Not Progressing

## 2014-07-01 NOTE — Progress Notes (Signed)
Utilization Review Completed.Donne Anon T12/08/2013

## 2014-07-01 NOTE — Progress Notes (Signed)
CRITICAL VALUE ALERT  Critical value received:  Troponin 1.86  Date of notification:  07/01/14  Time of notification:  4035  Critical value read back:Yes.    Nurse who received alert:  Toney Sang  MD notified (1st page):  Dr. Arcelia Jew   Time of first page:  0050  MD notified (2nd page):  Time of second page:  Responding MD:  Dr. Arcelia Jew to the bedside to assess   Time MD responded:  [phone]

## 2014-07-01 NOTE — Progress Notes (Signed)
BP 85/42, HR 72, patient lethargic, only oriented to self. MD notified and to the bedside to assess. New orders received will continue to monitor.   Lum Babe, RN

## 2014-07-01 NOTE — Progress Notes (Addendum)
INTERNAL MEDICINE TEACHING SERVICE Night Float Progress Note   Subjective:   Patient remains hypotensive despite 2.5 L of normal saline.  She has received a dose of Rocephin 1 g and 1 dose of Levaquin 700 mg. Possible source of infection is presumed to be a UTI.    Objective:    BP 98/40 mmHg  Pulse 74  Temp(Src) 98 F (36.7 C) (Oral)  Resp 17  SpO2 100%   Labs: Basic Metabolic Pa severe sepsis:nel:    Component Value Date/Time   NA 141 06/30/2014 1300   K 4.7 06/30/2014 1300   CL 104 06/30/2014 1300   CO2 17* 06/30/2014 1300   BUN 31* 06/30/2014 1300   CREATININE 1.28* 06/30/2014 1300   GLUCOSE 360* 06/30/2014 1300   CALCIUM 9.4 06/30/2014 1300    CBC:    Component Value Date/Time   WBC 13.4* 06/30/2014 1300   HGB 11.3* 06/30/2014 1300   HCT 34.9* 06/30/2014 1300   PLT 275 06/30/2014 1300   MCV 91.1 06/30/2014 1300   NEUTROABS 9.7* 06/30/2014 0241   LYMPHSABS 1.5 06/30/2014 0241   MONOABS 1.1* 06/30/2014 0241   EOSABS 0.2 06/30/2014 0241   BASOSABS 0.0 06/30/2014 0241    Cardiac Enzymes: Lab Results  Component Value Date   TROPONINI 1.86* 06/30/2014    Physical Exam: General: Vital signs reviewed and noted. Elderly woman, unable to interact. Granddaughter at bedside.   Lungs:  Normal respiratory effort. Clear to auscultation BL without crackles or wheezes.  Heart: RRR. S1 and S2 normal without gallop, murmur, or rubs.  Abdomen:  BS normoactive. Soft, Nondistended, non-tender.  No masses or organomegaly.  Extremities: Big superficial bruise over her lateral right upper arm. She has a closed fracture of the left humerus.     Assessment/ Plan:    Severe Sepsis with hypotension and AKI: she has not responded to aggressive rehydration with 3L of normal saline over the last 6 hours. She has a peripheral access. Presumed source of infection is urinary tract based on U/A. Blood culture and urine cultures are still pending. Her physical exam does not reveal other  possible source though abdominal infection can not be excluded. She does have a large skin bruise over her left upper arm but this does not appear infected at this time. Lactic acid level is 1.4. Chest x ray and head ct scan are unremarkable.  Plan  - cont with IV fluid. Will aim to give an additional 2 L or until MAP >65 mm Hg - will broaden antibiotic coverage at this time until more microbiological data is back  - stop Rocephin  - start Zosyn and Vanc - will recheck lactic acid level - unable to insert a foley catheter >> attempted X3. Ideally this would be needed to monitor strict ins and outs. - discussed with Dr Elsworth Soho of critical care who agreed with the current plan. Will cont to monitor clinically. Vasopressors are not indicated.   NSTEMI: EKG with TWI in the pericordial leads. Trop elevated at 1.8. Likely due to demand ischemia. Patient unable to state whether she has chest pain. No cardiac history. No prior echocardiogram.  Plan  - will trend trop and consider heparin if they are trending up - no BB and NTG due to low BP - consider cardiology consult in the am  Signed:  Jessee Avers, MD PGY-3 Internal Medicine Teaching Service Pager: [phone] 07/01/2014, 2:24 AM
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
## 2014-07-01 NOTE — Progress Notes (Signed)
CRITICAL VALUE ALERT  Critical value received:  Ca 5.9  Date of notification:  07/01/14  Time of notification:  1505  Critical value read back:Yes.    Nurse who received alert:  Toney Sang  MD notified (1st page):  Dr. Arcelia Jew  Time of first page:  0500  MD notified (2nd page):  Time of second page:  Responding MD:  Dr. Arcelia Jew; this RN saw blood draw, blood appeared to be very dilutional, spoke with lab they will put in for a recollect of the CBC and BMP    Time MD responded:  [phone]

## 2014-07-01 NOTE — Progress Notes (Signed)
PT Cancellation Note  Patient Details Name: Molly Ashley MRN: [id number] DOB: 1913/05/20   Cancelled Treatment:    Reason Eval/Treat Not Completed: Patient not medically ready.  Spoke with RN who indicates decreased BP, lethargy, and pt with elevated Troponin.  Will hold PT at this time and check back tomorrow.     [name], Thornton Papas 07/01/2014, 11:42 AM

## 2014-07-01 NOTE — Progress Notes (Signed)
OT Cancellation Note  Patient Details Name: Molly Ashley MRN: [id number] DOB: August 24, 1912   Cancelled Treatment:    Reason Eval/Treat Not Completed: Patient not medically ready. Pt with low BP and elevated troponin. Will check back tomorrow.  Benito Mccreedy OTR/L [phone] 07/01/2014, 12:11 PM
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
## 2014-07-01 NOTE — ED Provider Notes (Signed)
CSN: [id number]     Arrival date & time 06/27/14  1632 History   First MD Initiated Contact with Patient 06/27/14 2035     Chief Complaint  Patient presents with  . Fall     (Consider location/radiation/quality/duration/timing/severity/associated sxs/prior Treatment) HPI   Pleasant 80 year old female brought in by family for evaluation of market ecchymosis to left upper extremity. Recently evaluated after a fall. There are concerned about the extents of the bruising and or swelling. Denies any acute injury. Baseline mental status per family. No blood thinners.  Past Medical History  Diagnosis Date  . Arthritis   . Cancer   . Diabetes mellitus without complication   . Cataract    Past Surgical History  Procedure Laterality Date  . Abdominal hysterectomy    . Eye surgery     History reviewed. No pertinent family history. History  Substance Use Topics  . Smoking status: Never Smoker   . Smokeless tobacco: Never Used  . Alcohol Use: No   OB History    No data available     Review of Systems  All systems reviewed and negative, other than as noted in HPI.   Allergies  Cephalexin  Home Medications   Prior to Admission medications   Medication Sig Start Date End Date Taking? Authorizing Provider  acetaminophen (TYLENOL) 500 MG tablet Take 500 mg by mouth every 6 (six) hours as needed (for pain.).    Historical Provider, MD  cephALEXin (KEFLEX) 500 MG capsule Take 1 capsule (500 mg total) by mouth every 12 (twelve) hours. 06/30/14   Kalman Drape, MD  donepezil (ARICEPT) 5 MG tablet Take 5 mg by mouth at bedtime.  06/02/14   Historical Provider, MD  glipiZIDE (GLUCOTROL) 5 MG tablet Take 5 mg by mouth daily.  03/23/13   Historical Provider, MD  Polyethyl Glycol-Propyl Glycol (SYSTANE OP) Apply 1 drop to eye as needed (dry eyes).    Historical Provider, MD  propranolol (INDERAL) 60 MG tablet Take 30 mg by mouth 2 (two) times daily.    Historical Provider, MD  simvastatin  (ZOCOR) 10 MG tablet Take 10 mg by mouth at bedtime.    Historical Provider, MD  sitaGLIPtin (JANUVIA) 25 MG tablet Take 25 mg by mouth every other day.     Historical Provider, MD   BP 138/69 mmHg  Pulse 72  Temp(Src) 98.6 F (37 C) (Oral)  Resp 20  SpO2 99% Physical Exam  Constitutional: She appears well-developed and well-nourished. No distress.  HENT:  Head: Normocephalic.  Subacute appearing left facial ecchymosis.  Eyes: Conjunctivae are normal. Right eye exhibits no discharge. Left eye exhibits no discharge.  Neck: Neck supple.  Cardiovascular: Normal rate, regular rhythm and normal heart sounds.  Exam reveals no gallop and no friction rub.   No murmur heard. Pulmonary/Chest: Effort normal and breath sounds normal. No respiratory distress.  Abdominal: Soft. She exhibits no distension. There is no tenderness.  Musculoskeletal: She exhibits no edema or tenderness.  Extensive ecchymosis of essentially the entire left upper extremity. Tenderness to palpation in the mid forearm. It was actively range the shoulder, elbow and the wrist.  Neurological: She is alert.  Skin: Skin is warm and dry.  Psychiatric: She has a normal mood and affect. Her behavior is normal. Thought content normal.  Nursing note and vitals reviewed.   ED Course  Procedures (including critical care time) Labs Review Labs Reviewed  CBG MONITORING, ED - Abnormal; Notable for the following:  Glucose-Capillary 226 (*)    All other components within normal limits    Imaging Review Dg Forearm Left  06/30/2014   CLINICAL DATA:  Arm bruising. Fluid collection on the ulnar aspect of the forearm. Subsequent encounter.  EXAM: LEFT FOREARM - 2 VIEW  COMPARISON:  06/27/2014.  FINDINGS: The radius and ulna appear normal. There is diffuse subcutaneous edema extending from above the elbow through the forearm. Old ulnar styloid avulsion. Osteoarthritis of the wrist.  IMPRESSION: Soft tissue edema.  No osseous injury.    Electronically Signed   By: Dereck Ligas M.D.   On: 06/30/2014 14:59   Ct Head Wo Contrast  06/30/2014   CLINICAL DATA:  80 year old female with altered mental status. Initial encounter.  EXAM: CT HEAD WITHOUT CONTRAST  TECHNIQUE: Contiguous axial images were obtained from the base of the skull through the vertex without intravenous contrast.  COMPARISON:  06/24/2014 and prior head CTs  FINDINGS: Atrophy and chronic small-vessel white matter ischemic changes are again noted.  No acute intracranial abnormalities are identified, including mass lesion or mass effect, hydrocephalus, extra-axial fluid collection, midline shift, hemorrhage, or acute infarction.  The visualized bony calvarium is unremarkable.  Left frontal scalp soft tissue swelling has decreased.  IMPRESSION: No evidence of acute intracranial abnormality.  Decreased left frontal scalp soft tissue swelling.  Atrophy and chronic small-vessel white matter ischemic changes.   Electronically Signed   By: Hassan Rowan M.D.   On: 06/30/2014 23:04   Dg Chest Port 1 View  06/30/2014   CLINICAL DATA:  Fracture of the the LEFT humerus.  EXAM: PORTABLE CHEST - 1 VIEW  COMPARISON:  06/30/2014.  05/04/2013.  FINDINGS: Comminuted proximal LEFT humerus fracture is noted along with calcific tendinitis.  Cardiomegaly. Tortuous thoracic aorta. Aortic arch atherosclerosis. RIGHT shoulder hemiarthroplasty noted. There is no acute cardiopulmonary disease. Age related interstitial opacity, most prominent at the lung bases. No airspace disease. No effusion. No displaced rib fractures or pneumothorax.  IMPRESSION: 1. Cardiomegaly without failure. 2. No acute cardiopulmonary disease.   Electronically Signed   By: Dereck Ligas M.D.   On: 06/30/2014 19:38   Dg Humerus Left  06/30/2014   CLINICAL DATA:  Bruising about the left shoulder and upper arm. Pain. The patient is demented and unable to provide further history. Initial encounter.  EXAM: LEFT HUMERUS - 2+ VIEW   COMPARISON:  None.  FINDINGS: There is surgical neck fracture of the left humerus with 1 shaft with anterior displacement and impaction. The humeral head is located. There is acromioclavicular degenerative change and calcific rotator cuff tendinopathy.  IMPRESSION: Acute and impacted and anteriorly displaced surgical neck fracture left humerus.   Electronically Signed   By: Inge Rise M.D.   On: 06/30/2014 14:58     EKG Interpretation None      MDM   Final diagnoses:  Traumatic ecchymosis of left upper arm, subsequent encounter    80 year old female with significant ecchymosis to her left upper extremity as well as the left side of her face. Recently evaluated for a fall. Ecchymosis is related to this. She denies any acute injury since then. She is having some pain in her left forearm and she is tender there. This is not imaged on last evaluation. Imaged today distention acute abnormality.    Virgel Manifold, MD 07/01/14 2124

## 2014-07-01 NOTE — Consult Note (Signed)
Name: Molly Ashley MRN: [id number] DOB: Feb 05, 1913    ADMISSION DATE:  06/30/2014 CONSULTATION DATE:  07/01/14  REFERRING MD :  Dr. Ellwood Dense  CHIEF COMPLAINT:  Hypotension   BRIEF PATIENT DESCRIPTION: 80 y/o F admitted on 12/2 with recurrent falls, agitation & UTI.  Worsened with hypotension and PCCM consulted.    SIGNIFICANT EVENTS    STUDIES:  12/01  UC >> many bacteria, trace leukoctyes   HISTORY OF PRESENT ILLNESS:  80 y/o F with a PMH of DM, Cataracts, Arthritis admitted on 12/2 with recurrent falls, agitation & UTI.  Patient recently was seen 11/24 in an ER after suffering a fall while trying to go to a restroom.  She had resultant bruises to her face and arm.  ER work up was negative and she was discharged on tramadol.  The family reported she continued to worsen with weakness & hallucinations.  They stopped the tramadol thinking this was contributing to the falls.  She returned to an ER on 12/1 with agitation and an apparent fall in the outside hospital ER.  Patient was admitted to [facility] SDU by IM TS.  She was treated with IV antibiotics and cultured (negative to date).  Patient continued to decline with low blood pressure and change in mental status and PCCM consulted for evaluation.     PAST MEDICAL HISTORY :   has a past medical history of Arthritis; Cancer; Diabetes mellitus without complication; and Cataract.  has past surgical history that includes Abdominal hysterectomy and Eye surgery.     Prior to Admission medications   Medication Sig Start Date End Date Taking? Authorizing Provider  acetaminophen (TYLENOL) 500 MG tablet Take 500 mg by mouth every 6 (six) hours as needed (for pain.).    Historical Provider, MD  cephALEXin (KEFLEX) 500 MG capsule Take 1 capsule (500 mg total) by mouth every 12 (twelve) hours. 06/30/14   Kalman Drape, MD  donepezil (ARICEPT) 5 MG tablet Take 5 mg by mouth at bedtime.  06/02/14   Historical Provider, MD  glipiZIDE (GLUCOTROL) 5 MG tablet  Take 5 mg by mouth daily.  03/23/13   Historical Provider, MD  Polyethyl Glycol-Propyl Glycol (SYSTANE OP) Apply 1 drop to eye as needed (dry eyes).    Historical Provider, MD  propranolol (INDERAL) 60 MG tablet Take 30 mg by mouth 2 (two) times daily.    Historical Provider, MD  simvastatin (ZOCOR) 10 MG tablet Take 10 mg by mouth at bedtime.    Historical Provider, MD  sitaGLIPtin (JANUVIA) 25 MG tablet Take 25 mg by mouth every other day.     Historical Provider, MD   Allergies  Allergen Reactions  . Cephalexin Other (See Comments)    Confusion/Altered mental status per family     FAMILY HISTORY:  family history is not on file.   SOCIAL HISTORY:  reports that she has never smoked. She has never used smokeless tobacco. She reports that she does not drink alcohol or use illicit drugs.  REVIEW OF SYSTEMS:  Unable to complete as patient is altered.    SUBJECTIVE:   VITAL SIGNS: Temp:  [97.8 F (36.6 C)-101 F (38.3 C)] 98.6 F (37 C) (12/02 1114) Pulse Rate:  [69-133] 72 (12/02 1221) Resp:  [10-32] 23 (12/02 1221) BP: (55-200)/(21-162) 55/37 mmHg (12/02 1221) SpO2:  [92 %-100 %] 100 % (12/02 1221) Weight:  [119 lb 7.8 oz (54.2 kg)] 119 lb 7.8 oz (54.2 kg) (12/01 2000)  PHYSICAL EXAMINATION: General:  Frail  elderly female in NAD  Neuro:  Obtunded, no evidence of pain  HEENT:  Bruising to L side of face, mm pink/moist  Cardiovascular:  s1s2 rrr, no m/r/g Lungs:  resp's even/non-labored, lungs bilaterally with few scattered rhonchi Abdomen:  Obese / soft, bsx4 active Musculoskeletal:  No acute deformities Skin:  Multiple scattered bruises, skin tears    Recent Labs Lab 06/30/14 1300 07/01/14 0350 07/01/14 0900  NA 141 148* 149*  K 4.7 3.8 3.5*  CL 104 121* 118*  CO2 17* 19 18*  BUN 31* 27* 23  CREATININE 1.28* 1.21* 1.21*  GLUCOSE 360* 120* 147*    Recent Labs Lab 06/30/14 0241 06/30/14 1300 07/01/14 0900  HGB 11.2* 11.3* 8.6*  HCT 34.3* 34.9* 26.1*  WBC  12.4* 13.4* 13.0*  PLT 264 275 212   Dg Forearm Left  06/30/2014   CLINICAL DATA:  Arm bruising. Fluid collection on the ulnar aspect of the forearm. Subsequent encounter.  EXAM: LEFT FOREARM - 2 VIEW  COMPARISON:  06/27/2014.  FINDINGS: The radius and ulna appear normal. There is diffuse subcutaneous edema extending from above the elbow through the forearm. Old ulnar styloid avulsion. Osteoarthritis of the wrist.  IMPRESSION: Soft tissue edema.  No osseous injury.   Electronically Signed   By: Dereck Ligas M.D.   On: 06/30/2014 14:59   Ct Head Wo Contrast  06/30/2014   CLINICAL DATA:  80 year old female with altered mental status. Initial encounter.  EXAM: CT HEAD WITHOUT CONTRAST  TECHNIQUE: Contiguous axial images were obtained from the base of the skull through the vertex without intravenous contrast.  COMPARISON:  06/24/2014 and prior head CTs  FINDINGS: Atrophy and chronic small-vessel white matter ischemic changes are again noted.  No acute intracranial abnormalities are identified, including mass lesion or mass effect, hydrocephalus, extra-axial fluid collection, midline shift, hemorrhage, or acute infarction.  The visualized bony calvarium is unremarkable.  Left frontal scalp soft tissue swelling has decreased.  IMPRESSION: No evidence of acute intracranial abnormality.  Decreased left frontal scalp soft tissue swelling.  Atrophy and chronic small-vessel white matter ischemic changes.   Electronically Signed   By: Hassan Rowan M.D.   On: 06/30/2014 23:04   Dg Chest Port 1 View  06/30/2014   CLINICAL DATA:  Fracture of the the LEFT humerus.  EXAM: PORTABLE CHEST - 1 VIEW  COMPARISON:  06/30/2014.  05/04/2013.  FINDINGS: Comminuted proximal LEFT humerus fracture is noted along with calcific tendinitis.  Cardiomegaly. Tortuous thoracic aorta. Aortic arch atherosclerosis. RIGHT shoulder hemiarthroplasty noted. There is no acute cardiopulmonary disease. Age related interstitial opacity, most prominent  at the lung bases. No airspace disease. No effusion. No displaced rib fractures or pneumothorax.  IMPRESSION: 1. Cardiomegaly without failure. 2. No acute cardiopulmonary disease.   Electronically Signed   By: Dereck Ligas M.D.   On: 06/30/2014 19:38   Dg Humerus Left  06/30/2014   CLINICAL DATA:  Bruising about the left shoulder and upper arm. Pain. The patient is demented and unable to provide further history. Initial encounter.  EXAM: LEFT HUMERUS - 2+ VIEW  COMPARISON:  None.  FINDINGS: There is surgical neck fracture of the left humerus with 1 shaft with anterior displacement and impaction. The humeral head is located. There is acromioclavicular degenerative change and calcific rotator cuff tendinopathy.  IMPRESSION: Acute and impacted and anteriorly displaced surgical neck fracture left humerus.   Electronically Signed   By: Inge Rise M.D.   On: 06/30/2014 14:58    ASSESSMENT /  PLAN:  Failure to Thrive UTI  Multiple Falls  Humerus Fracture Dementia - moderate to severe   81 y/o frail elderly female with recent decline in functional status and falls.  Appears to be actively transitioning to dying.  Family at bedside and updated on status.  They appropriately agree that comfort should be the focus of her care.     Plan: No further lab draws Morphine PRN for dyspnea Rx with IVF and antibiotics Transfer to medical floor to allow family to have open visitation  Shift toward comfort focused care.  D/C orders that do not contribute to comfort  Full DNR / DNI, NO vasopressors  Noe Gens, NP-C [facility] Pgr: [phone] or [phone]   07/01/2014, 1:44 PM  STAFF NOTE: I, Merrie Roof, MD FACP have personally reviewed patient's available data, including medical history, events of note, physical examination and test results as part of my evaluation. I have discussed with resident/NP and other care providers such as pharmacist, RN and RRT. In addition, I  personally evaluated patient and elicited key findings of: she is in process of dying, has distress, UA unimpressive as source of change in MS, other etiology possible, but further work up would be futile, correct Na, continued ABX, family reqeusts no more blood draws, full comfort care to floor Call if needed in future   Lavon Paganini. Titus Mould, MD, [place] Pgr: [facility] 07/01/2014 4:09 PM

## 2014-07-01 NOTE — Progress Notes (Signed)
Patients blood pressure decreased to 75/25 repeat blood pressure taken with slight improvement of 88/31.  Physician in room at bedside.  Verbal order given to increase fluids from NS at 48ml/hr to 156ml/hr.  Family at bedside updated to patients change in status.  Will continue to monitor.

## 2014-07-01 NOTE — Plan of Care (Signed)
Problem: Phase I Progression Outcomes Goal: Pain controlled with appropriate interventions Outcome: Progressing Goal: Voiding-avoid urinary catheter unless indicated Outcome: Completed/Met Date Met:  07/01/14 Goal: Hemodynamically stable Outcome: Progressing     

## 2014-07-01 NOTE — Progress Notes (Signed)
Notified Dr. Raelene Bott of patients BP of 200/162 shortly followed by a reading of 55/37.  Patients HR remains in the 70-80's and oxygen saturation is stable throughout at 100%.  No new orders given at this time.  Will continue to monitor.
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
## 2014-07-01 NOTE — Progress Notes (Signed)
Patient to transfer to 6N09 report given to receiving nurse Lovena Le questions answered at this time.

## 2014-07-02 LAB — BASIC METABOLIC PANEL
Anion gap: 16 — ABNORMAL HIGH (ref 5–15)
BUN: 20 mg/dL (ref 6–23)
CHLORIDE: 117 meq/L — AB (ref 96–112)
CO2: 15 mEq/L — ABNORMAL LOW (ref 19–32)
Calcium: 8.2 mg/dL — ABNORMAL LOW (ref 8.4–10.5)
Creatinine, Ser: 1.29 mg/dL — ABNORMAL HIGH (ref 0.50–1.10)
GFR calc non Af Amer: 33 mL/min — ABNORMAL LOW (ref >90)
GFR, EST AFRICAN AMERICAN: 38 mL/min — AB (ref >90)
Glucose, Bld: 127 mg/dL — ABNORMAL HIGH (ref 70–99)
Potassium: 3.6 mEq/L — ABNORMAL LOW (ref 3.7–5.3)
Sodium: 148 mEq/L — ABNORMAL HIGH (ref 137–147)

## 2014-07-02 LAB — GLUCOSE, CAPILLARY: GLUCOSE-CAPILLARY: 126 mg/dL — AB (ref 70–99)

## 2014-07-02 LAB — CBC
HEMATOCRIT: 28 % — AB (ref 36.0–46.0)
HEMOGLOBIN: 8.8 g/dL — AB (ref 12.0–15.0)
MCH: 29.9 pg (ref 26.0–34.0)
MCHC: 31.4 g/dL (ref 30.0–36.0)
MCV: 95.2 fL (ref 78.0–100.0)
Platelets: 202 10*3/uL (ref 150–400)
RBC: 2.94 MIL/uL — ABNORMAL LOW (ref 3.87–5.11)
RDW: 13.9 % (ref 11.5–15.5)
WBC: 11.7 10*3/uL — ABNORMAL HIGH (ref 4.0–10.5)

## 2014-07-02 MED ORDER — MORPHINE SULFATE 2 MG/ML IJ SOLN: 0.5000 mg | INTRAMUSCULAR | Status: DC | PRN

## 2014-07-02 MED ORDER — SENNA 8.6 MG PO TABS
1.0000 | ORAL_TABLET | Freq: Every day | ORAL | Status: DC
  Administered 2014-07-03 – 2014-07-06 (×4): 8.6 mg via ORAL
  Filled 2014-07-02 (×5): qty 1

## 2014-07-02 MED ORDER — INSULIN ASPART 100 UNIT/ML ~~LOC~~ SOLN
0.0000 [IU] | Freq: Three times a day (TID) | SUBCUTANEOUS | Status: DC
  Administered 2014-07-02: 1 [IU] via SUBCUTANEOUS
  Administered 2014-07-03 – 2014-07-04 (×4): 2 [IU] via SUBCUTANEOUS
  Administered 2014-07-04: 1 [IU] via SUBCUTANEOUS
  Administered 2014-07-04 – 2014-07-05 (×4): 2 [IU] via SUBCUTANEOUS
  Administered 2014-07-06 (×2): 3 [IU] via SUBCUTANEOUS
  Administered 2014-07-06: 7 [IU] via SUBCUTANEOUS

## 2014-07-02 MED ORDER — MUPIROCIN CALCIUM 2 % EX CREA
TOPICAL_CREAM | Freq: Two times a day (BID) | CUTANEOUS | Status: DC
Start: 2014-07-02 — End: 2014-07-06
  Administered 2014-07-02 – 2014-07-04 (×4): via TOPICAL
  Administered 2014-07-04: 1 via TOPICAL
  Administered 2014-07-05 – 2014-07-06 (×3): via TOPICAL
  Filled 2014-07-02: qty 15
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
## 2014-07-02 NOTE — Clinical Social Work Note (Addendum)
Clinical Social Work Department CLINICAL SOCIAL WORK PLACEMENT NOTE 07/02/2014  Patient:  Molly Ashley, Molly Ashley  Account Number:  [id number] Admit date:  06/30/2014  Clinical Social Worker:  [name], CLINICAL SOCIAL WORKER  Date/time:  07/02/2014 04:20 PM  Clinical Social Work is seeking post-discharge placement for this patient at the following level of care:   SKILLED NURSING   (*CSW will update this form in Epic as items are completed)   07/02/2014  Patient/family provided with [facility] Department of Clinical Social Work's list of facilities offering this level of care within the geographic area requested by the patient (or if unable, by the patient's family).  07/02/2014  Patient/family informed of their freedom to choose among providers that offer the needed level of care, that participate in Medicare, Medicaid or managed care program needed by the patient, have an available bed and are willing to accept the patient.  07/02/2014  Patient/family informed of [facility]' ownership interest in [facility], as well as of the fact that they are under no obligation to receive care at this facility.  PASARR submitted to EDS on 07/02/2014 PASARR number received on 07/02/2014  FL2 transmitted to all facilities in geographic area requested by pt/family on  07/02/2014 FL2 transmitted to all facilities within larger geographic area on   Patient informed that his/her managed care company has contracts with or will negotiate with  certain facilities, including the following:     Patient/family informed of bed offers received: 07/03/14 Shelle Iron, LCSW)  Patient chooses bed at [facility] Physician recommends and patient chooses bed at    Patient to be transferred to on   Patient to be transferred to facility by  Patient and family notified of transfer on  Name of family member notified:    The following physician request were entered in Epic  Additional  Comments: 07/03/14: CSW contacted Bethena Roys in admissions at [facility] to inform them that family chose their facility.  PT evaluation/treatment notes needed. EPIC notes checked for patient and PT has attempted to see patient but she was not medically able to participate. CSW rec'd call from patient's nurse and PT evaluation requested as needed for insurance authorization. PT evaluation completed and forwarded to [facility] to submit to [facility] for insurance authorization. Patient is not [employer]      [name], [place] Social Worker [phone]
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
## 2014-07-02 NOTE — Consult Note (Addendum)
WOC wound consult note Reason for Consult: Consult requested for sacrum and other wounds.  Pt fell at home prior to admission. Wound type: Left chin with dry brown dry scabbed abrasion; .5X2cm.  No odor or drainage. Sacrum with stage 1 wound; 6x6cm red and does not blanch. Small abrasion in middle; .2X.2X.1cm, appears to be a split in the skin.  No odor or drainage. Right arm with full thickness skin tear; 2 wounds separated by a small island of intact skin.  Total area is 10X6X.1cm.  Moist pink wound bed with mad amt pink drainage, no odor. Pressure Ulcer POA: Yes to sacrum area Dressing procedure/placement/frequency: Bactroban to promote moist healing to chin, foam dressing to protect from further injury.  No topical treatment needed to sacral area at this time.  Xeroform gauze to reduce discomfort with dressing changes to arm and promote healing.  Discussed plan of care with family members at bedside and they deny further questions at this time. Please re-consult if further assistance is needed.  Thank-you,  Julien Girt MSN, [place], [place], [place], [place]

## 2014-07-03 ENCOUNTER — Inpatient Hospital Stay (HOSPITAL_COMMUNITY): Payer: Medicare HMO

## 2014-07-03 DIAGNOSIS — J342 Deviated nasal septum: Secondary | ICD-10-CM

## 2014-07-03 DIAGNOSIS — L89899 Pressure ulcer of other site, unspecified stage: Secondary | ICD-10-CM

## 2014-07-03 DIAGNOSIS — K5900 Constipation, unspecified: Secondary | ICD-10-CM

## 2014-07-03 LAB — CBC
HCT: 28.8 % — ABNORMAL LOW (ref 36.0–46.0)
HEMOGLOBIN: 9 g/dL — AB (ref 12.0–15.0)
MCH: 29.9 pg (ref 26.0–34.0)
MCHC: 31.3 g/dL (ref 30.0–36.0)
MCV: 95.7 fL (ref 78.0–100.0)
Platelets: 221 10*3/uL (ref 150–400)
RBC: 3.01 MIL/uL — AB (ref 3.87–5.11)
RDW: 14.1 % (ref 11.5–15.5)
WBC: 11.4 10*3/uL — AB (ref 4.0–10.5)

## 2014-07-03 LAB — BASIC METABOLIC PANEL
Anion gap: 22 — ABNORMAL HIGH (ref 5–15)
BUN: 22 mg/dL (ref 6–23)
CO2: 12 mEq/L — ABNORMAL LOW (ref 19–32)
Calcium: 8.5 mg/dL (ref 8.4–10.5)
Chloride: 115 mEq/L — ABNORMAL HIGH (ref 96–112)
Creatinine, Ser: 1.38 mg/dL — ABNORMAL HIGH (ref 0.50–1.10)
GFR calc non Af Amer: 30 mL/min — ABNORMAL LOW (ref >90)
GFR, EST AFRICAN AMERICAN: 35 mL/min — AB (ref >90)
Glucose, Bld: 177 mg/dL — ABNORMAL HIGH (ref 70–99)
POTASSIUM: 3.8 meq/L (ref 3.7–5.3)
SODIUM: 149 meq/L — AB (ref 137–147)

## 2014-07-03 LAB — FOLATE: FOLATE: 9.8 ng/mL

## 2014-07-03 LAB — GLUCOSE, CAPILLARY
GLUCOSE-CAPILLARY: 170 mg/dL — AB (ref 70–99)
GLUCOSE-CAPILLARY: 157 mg/dL — AB (ref 70–99)
GLUCOSE-CAPILLARY: 172 mg/dL — AB (ref 70–99)
Glucose-Capillary: 166 mg/dL — ABNORMAL HIGH (ref 70–99)

## 2014-07-03 LAB — RETICULOCYTES
RBC.: 3.01 MIL/uL — ABNORMAL LOW (ref 3.87–5.11)
Retic Count, Absolute: 63.2 10*3/uL (ref 19.0–186.0)
Retic Ct Pct: 2.1 % (ref 0.4–3.1)

## 2014-07-03 LAB — FERRITIN: Ferritin: 221 ng/mL (ref 10–291)

## 2014-07-03 LAB — IRON AND TIBC
IRON: 30 ug/dL — AB (ref 42–135)
SATURATION RATIOS: 19 % — AB (ref 20–55)
TIBC: 154 ug/dL — AB (ref 250–470)
UIBC: 124 ug/dL — ABNORMAL LOW (ref 125–400)

## 2014-07-03 LAB — VITAMIN B12: VITAMIN B 12: 345 pg/mL (ref 211–911)

## 2014-07-03 MED ORDER — LEVOFLOXACIN 250 MG PO TABS
250.0000 mg | ORAL_TABLET | Freq: Every day | ORAL | Status: DC
  Administered 2014-07-03 – 2014-07-06 (×4): 250 mg via ORAL
  Filled 2014-07-03 (×5): qty 1

## 2014-07-03 MED ORDER — PIPERACILLIN-TAZOBACTAM IN DEX 2-0.25 GM/50ML IV SOLN
2.2500 g | Freq: Three times a day (TID) | INTRAVENOUS | Status: DC
  Filled 2014-07-03 (×2): qty 50

## 2014-07-03 MED ORDER — FUROSEMIDE 20 MG PO TABS
20.0000 mg | ORAL_TABLET | Freq: Once | ORAL | Status: AC
  Administered 2014-07-03: 20 mg via ORAL

## 2014-07-03 MED ORDER — FUROSEMIDE 20 MG PO TABS
ORAL_TABLET | ORAL | Status: AC
  Filled 2014-07-03: qty 1
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
## 2014-07-03 NOTE — Evaluation (Signed)
Clinical/Bedside Swallow Evaluation Patient Details  Name: Molly Ashley MRN: [id number] Date of Birth: May 09, 1913  Today's Date: 07/03/2014 Time: 1275-1700 SLP Time Calculation (min) (ACUTE ONLY): 17 min  Past Medical History:  Past Medical History  Diagnosis Date  . Arthritis   . Cancer   . Diabetes mellitus without complication   . Cataract    Past Surgical History:  Past Surgical History  Procedure Laterality Date  . Abdominal hysterectomy    . Eye surgery     HPI:  Patient is a 80 year old female with a history of dementia, type 2 diabetes, coronary artery disease who was admitted for acute encephalopathy secondary to sepsis from urinary tract infection, now on comfort care per family interpretation of patient's wishes. Pt failed a nursing swallow screen; she is more alert today; swallow evaluation ordered.     Assessment / Plan / Recommendation Clinical Impression  Pt presents with a normal oropharyngeal swallow with active mastication, consistent swalllow response, and no s/s of aspiration.  Recommend resuming a regular diet, thin liquids.  No f/u warranted.      Aspiration Risk  Mild    Diet Recommendation Regular;Thin liquid   Liquid Administration via: Cup;Straw Medication Administration: Whole meds with liquid Supervision: Staff to assist with self feeding    Other  Recommendations Oral Care Recommendations: Oral care BID   Follow Up Recommendations  None        Swallow Study Prior Functional Status       General Date of Onset: 06/30/14 HPI: Patient is a 80 year old female with a history of dementia, type 2 diabetes, coronary artery disease who was admitted for acute encephalopathy secondary to sepsis from urinary tract infection, now on comfort care per family interpretation of patient's wishes. Pt failed a nursing swallow screen; she is more alert today; swallow evaluation ordered.   Type of Study: Bedside swallow evaluation Previous Swallow  Assessment: none per records Diet Prior to this Study: NPO Temperature Spikes Noted: No Respiratory Status: Nasal cannula Behavior/Cognition: Alert;Cooperative;Pleasant mood Oral Cavity - Dentition: Dentures, top;Dentures, bottom Self-Feeding Abilities: Needs assist Patient Positioning: Upright in bed Baseline Vocal Quality: Clear Volitional Cough: Strong Volitional Swallow: Able to elicit    Oral/Motor/Sensory Function Overall Oral Motor/Sensory Function: Appears within functional limits for tasks assessed   Ice Chips Ice chips: Within functional limits   Thin Liquid Thin Liquid: Within functional limits Presentation: Cup;Straw    Nectar Thick Nectar Thick Liquid: Not tested   Honey Thick Honey Thick Liquid: Not tested   Puree Puree: Within functional limits Presentation: [place]. [place], [place] CCC/SLP Pager [phone]     Solid: Within functional limits Presentation: Self Fed       Molly Ashley 07/03/2014,10:07 AM

## 2014-07-03 NOTE — Progress Notes (Signed)
MD made aware that BMP was not done due to refusal of the family to draw blood if patient is ready for discharge.Md will look into it.
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
## 2014-07-03 NOTE — Care Management (Signed)
IM given. Maty Zeisler RN BSN  
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
## 2014-07-04 ENCOUNTER — Inpatient Hospital Stay (HOSPITAL_COMMUNITY): Payer: Medicare HMO

## 2014-07-04 LAB — BASIC METABOLIC PANEL
Anion gap: 21 — ABNORMAL HIGH (ref 5–15)
BUN: 24 mg/dL — ABNORMAL HIGH (ref 6–23)
CO2: 15 mEq/L — ABNORMAL LOW (ref 19–32)
CREATININE: 1.39 mg/dL — AB (ref 0.50–1.10)
Calcium: 8.7 mg/dL (ref 8.4–10.5)
Chloride: 113 mEq/L — ABNORMAL HIGH (ref 96–112)
GFR, EST AFRICAN AMERICAN: 35 mL/min — AB (ref >90)
GFR, EST NON AFRICAN AMERICAN: 30 mL/min — AB (ref >90)
Glucose, Bld: 184 mg/dL — ABNORMAL HIGH (ref 70–99)
Potassium: 3.4 mEq/L — ABNORMAL LOW (ref 3.7–5.3)
Sodium: 149 mEq/L — ABNORMAL HIGH (ref 137–147)

## 2014-07-04 LAB — CBC
HCT: 28.6 % — ABNORMAL LOW (ref 36.0–46.0)
Hemoglobin: 9.3 g/dL — ABNORMAL LOW (ref 12.0–15.0)
MCH: 30.5 pg (ref 26.0–34.0)
MCHC: 32.5 g/dL (ref 30.0–36.0)
MCV: 93.8 fL (ref 78.0–100.0)
PLATELETS: 216 10*3/uL (ref 150–400)
RBC: 3.05 MIL/uL — ABNORMAL LOW (ref 3.87–5.11)
RDW: 14.1 % (ref 11.5–15.5)
WBC: 11 10*3/uL — ABNORMAL HIGH (ref 4.0–10.5)

## 2014-07-04 LAB — GLUCOSE, CAPILLARY
Glucose-Capillary: 159 mg/dL — ABNORMAL HIGH (ref 70–99)
Glucose-Capillary: 170 mg/dL — ABNORMAL HIGH (ref 70–99)
Glucose-Capillary: 130 mg/dL — ABNORMAL HIGH (ref 70–99)
Glucose-Capillary: 160 mg/dL — ABNORMAL HIGH (ref 70–99)

## 2014-07-04 MED ORDER — FUROSEMIDE 20 MG PO TABS
20.0000 mg | ORAL_TABLET | Freq: Once | ORAL | Status: AC
  Administered 2014-07-04: 20 mg via ORAL
  Filled 2014-07-04: qty 1
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
## 2014-07-05 LAB — GLUCOSE, CAPILLARY
GLUCOSE-CAPILLARY: 158 mg/dL — AB (ref 70–99)
Glucose-Capillary: 191 mg/dL — ABNORMAL HIGH (ref 70–99)
Glucose-Capillary: 193 mg/dL — ABNORMAL HIGH (ref 70–99)
Glucose-Capillary: 154 mg/dL — ABNORMAL HIGH (ref 70–99)
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
## 2014-07-06 DIAGNOSIS — I248 Other forms of acute ischemic heart disease: Secondary | ICD-10-CM

## 2014-07-06 LAB — GLUCOSE, CAPILLARY
GLUCOSE-CAPILLARY: 214 mg/dL — AB (ref 70–99)
Glucose-Capillary: 212 mg/dL — ABNORMAL HIGH (ref 70–99)
Glucose-Capillary: 313 mg/dL — ABNORMAL HIGH (ref 70–99)

## 2014-07-06 MED ORDER — ACETAMINOPHEN 325 MG PO TABS: 650.0000 mg | ORAL_TABLET | Freq: Four times a day (QID) | ORAL | Status: DC | PRN

## 2014-07-06 MED ORDER — LEVOFLOXACIN 250 MG PO TABS: 250.0000 mg | ORAL_TABLET | Freq: Every day | ORAL | Status: DC

## 2014-07-06 MED ORDER — ENSURE COMPLETE PO LIQD
237.0000 mL | Freq: Two times a day (BID) | ORAL | Status: DC
  Administered 2014-07-06: 237 mL via ORAL

## 2014-07-06 NOTE — Discharge Instructions (Signed)
For your left shoulder fracture: use the sling for comfort when out of bed. It is ok for her arm to be out of the sling when in the chair or bed. Please follow up with orthopedics for your shoulder and your PCP for hospital follow up.

## 2014-07-06 NOTE — Progress Notes (Signed)
Orthopedic Tech Progress Note Patient Details:  Molly Ashley 1913-05-07 [id number]  Ortho Devices Type of Ortho Device: Arm sling Ortho Device/Splint Location: LUE Ortho Device/Splint Interventions: Criss Alvine 07/06/2014, 3:56 PM

## 2014-07-06 NOTE — Progress Notes (Signed)
  PROGRESS NOTE MEDICINE TEACHING ATTENDING   Day 6 of stay Patient name: Molly Ashley   Medical record number: [id number] Date of birth: Oct 29, 1912   Met with patient and examined her, met with granddaughter today. Doing well. Working with PT. Tolerating levaquin well. Will be discharged to [facility] today most likely.   I have discussed the care of this patient with my IM team residents. Please see the resident note for details.  Spink, [place] 07/06/2014, 3:24 PM.
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
## 2014-07-06 NOTE — Care Management Note (Signed)
  Page 1 of 1   07/06/2014     10:45:22 AM CARE MANAGEMENT NOTE 07/06/2014  Patient:  Molly Ashley, Molly Ashley   Account Number:  [id number]  Date Initiated:  07/06/2014  Documentation initiated by:  Magdalen Spatz  Subjective/Objective Assessment:     Action/Plan:   Anticipated DC Date:  07/06/2014   Anticipated DC Plan:  [place]  In-house referral  Clinical Social Worker         Choice offered to / List presented to:             Status of service:   Medicare Important Message given?  YES (If response is "NO", the following Medicare IM given date fields will be blank) Date Medicare IM given:  07/03/2014 Medicare IM given by:  Magdalen Spatz Date Additional Medicare IM given:  07/06/2014 Additional Medicare IM given by:  Magdalen Spatz  Discharge Disposition:    Per UR Regulation:    If discussed at Long Length of Stay Meetings, dates discussed:    Comments:

## 2014-07-06 NOTE — Clinical Social Work Note (Addendum)
3:00pm Molly Ashley called CSW to inform of insurance auth.  CSW paged DR to inform patient may leave today and ask to sign FL2 and DNR on chart.  2:30pm CSW spoke with Molly Ashley at [facility] again- Molly Ashley to call insurance rep and determine answer.  12:30pm CSW spoke with Molly Ashley in admissions at [facility]- they are still awaiting auth.Molly Ashley will contact CSW when [name] is given.  10:30am CSW called [facility] to see if insurance has authorized SNF- left message for admissions director Molly Ashley).  CSW will continue to follow.  [name], [place] Social Worker [phone]
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
## 2014-07-06 NOTE — Clinical Social Work Note (Signed)
Patient will discharge to [facility] SN[facility] Anticipated discharge date:07/06/14 Family notified:grandaughter-Mrs.[employer] by [employer]- called at 3:45  CSW signing off.  [name], [place] Social Worker [phone]
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
## 2014-07-07 LAB — CULTURE, BLOOD (ROUTINE X 2)
CULTURE: NO GROWTH
Culture: NO GROWTH

## 2014-07-19 ENCOUNTER — Inpatient Hospital Stay (HOSPITAL_COMMUNITY)
Admission: EM | Admit: 2014-07-19 | Discharge: 2014-07-22 | DRG: 194 | Disposition: A | Discharge status: Skilled Nursing Facility | Payer: Medicare HMO | Attending: Internal Medicine | Admitting: Internal Medicine

## 2014-07-19 ENCOUNTER — Encounter (HOSPITAL_COMMUNITY): Payer: Self-pay

## 2014-07-19 ENCOUNTER — Emergency Department (HOSPITAL_COMMUNITY): Payer: Medicare HMO

## 2014-07-19 DIAGNOSIS — N179 Acute kidney failure, unspecified: Secondary | ICD-10-CM | POA: Diagnosis present

## 2014-07-19 DIAGNOSIS — Z66 Do not resuscitate: Secondary | ICD-10-CM | POA: Diagnosis present

## 2014-07-19 DIAGNOSIS — Z96611 Presence of right artificial shoulder joint: Secondary | ICD-10-CM | POA: Diagnosis present

## 2014-07-19 DIAGNOSIS — E872 Acidosis: Secondary | ICD-10-CM | POA: Diagnosis not present

## 2014-07-19 DIAGNOSIS — Z955 Presence of coronary angioplasty implant and graft: Secondary | ICD-10-CM | POA: Diagnosis not present

## 2014-07-19 DIAGNOSIS — R339 Retention of urine, unspecified: Secondary | ICD-10-CM | POA: Diagnosis not present

## 2014-07-19 DIAGNOSIS — E785 Hyperlipidemia, unspecified: Secondary | ICD-10-CM | POA: Diagnosis present

## 2014-07-19 DIAGNOSIS — R0602 Shortness of breath: Secondary | ICD-10-CM | POA: Diagnosis not present

## 2014-07-19 DIAGNOSIS — Z90710 Acquired absence of both cervix and uterus: Secondary | ICD-10-CM

## 2014-07-19 DIAGNOSIS — I2510 Atherosclerotic heart disease of native coronary artery without angina pectoris: Secondary | ICD-10-CM | POA: Diagnosis present

## 2014-07-19 DIAGNOSIS — Z881 Allergy status to other antibiotic agents status: Secondary | ICD-10-CM

## 2014-07-19 DIAGNOSIS — E8889 Other specified metabolic disorders: Secondary | ICD-10-CM | POA: Diagnosis present

## 2014-07-19 DIAGNOSIS — N189 Chronic kidney disease, unspecified: Secondary | ICD-10-CM

## 2014-07-19 DIAGNOSIS — E119 Type 2 diabetes mellitus without complications: Secondary | ICD-10-CM | POA: Diagnosis present

## 2014-07-19 DIAGNOSIS — Y95 Nosocomial condition: Secondary | ICD-10-CM | POA: Diagnosis present

## 2014-07-19 DIAGNOSIS — R001 Bradycardia, unspecified: Secondary | ICD-10-CM | POA: Diagnosis present

## 2014-07-19 DIAGNOSIS — J189 Pneumonia, unspecified organism: Principal | ICD-10-CM | POA: Diagnosis present

## 2014-07-19 DIAGNOSIS — R1310 Dysphagia, unspecified: Secondary | ICD-10-CM | POA: Diagnosis present

## 2014-07-19 DIAGNOSIS — M1990 Unspecified osteoarthritis, unspecified site: Secondary | ICD-10-CM | POA: Diagnosis present

## 2014-07-19 DIAGNOSIS — H269 Unspecified cataract: Secondary | ICD-10-CM | POA: Diagnosis present

## 2014-07-19 DIAGNOSIS — I509 Heart failure, unspecified: Secondary | ICD-10-CM

## 2014-07-19 DIAGNOSIS — R0902 Hypoxemia: Secondary | ICD-10-CM | POA: Diagnosis present

## 2014-07-19 DIAGNOSIS — F0390 Unspecified dementia without behavioral disturbance: Secondary | ICD-10-CM | POA: Diagnosis present

## 2014-07-19 DIAGNOSIS — L89151 Pressure ulcer of sacral region, stage 1: Secondary | ICD-10-CM | POA: Diagnosis present

## 2014-07-19 LAB — GLUCOSE, CAPILLARY
GLUCOSE-CAPILLARY: 238 mg/dL — AB (ref 70–99)
Glucose-Capillary: 292 mg/dL — ABNORMAL HIGH (ref 70–99)

## 2014-07-19 LAB — CBC WITH DIFFERENTIAL/PLATELET
BASOS PCT: 0 % (ref 0–1)
Basophils Absolute: 0 10*3/uL (ref 0.0–0.1)
EOS ABS: 0 10*3/uL (ref 0.0–0.7)
EOS PCT: 0 % (ref 0–5)
HCT: 32.9 % — ABNORMAL LOW (ref 36.0–46.0)
HEMOGLOBIN: 10.1 g/dL — AB (ref 12.0–15.0)
Lymphocytes Relative: 4 % — ABNORMAL LOW (ref 12–46)
Lymphs Abs: 0.5 10*3/uL — ABNORMAL LOW (ref 0.7–4.0)
MCH: 30 pg (ref 26.0–34.0)
MCHC: 30.7 g/dL (ref 30.0–36.0)
MCV: 97.6 fL (ref 78.0–100.0)
MONOS PCT: 2 % — AB (ref 3–12)
Monocytes Absolute: 0.2 10*3/uL (ref 0.1–1.0)
Neutro Abs: 11.8 10*3/uL — ABNORMAL HIGH (ref 1.7–7.7)
Neutrophils Relative %: 94 % — ABNORMAL HIGH (ref 43–77)
Platelets: 303 10*3/uL (ref 150–400)
RBC: 3.37 MIL/uL — ABNORMAL LOW (ref 3.87–5.11)
RDW: 14.7 % (ref 11.5–15.5)
WBC: 12.5 10*3/uL — ABNORMAL HIGH (ref 4.0–10.5)

## 2014-07-19 LAB — URINALYSIS, ROUTINE W REFLEX MICROSCOPIC
BILIRUBIN URINE: NEGATIVE
Glucose, UA: NEGATIVE mg/dL
KETONES UR: NEGATIVE mg/dL
Leukocytes, UA: NEGATIVE
NITRITE: NEGATIVE
Protein, ur: >300 mg/dL — AB
Specific Gravity, Urine: 1.014 (ref 1.005–1.030)
UROBILINOGEN UA: 0.2 mg/dL (ref 0.0–1.0)
pH: 5.5 (ref 5.0–8.0)

## 2014-07-19 LAB — PROCALCITONIN: PROCALCITONIN: 4.08 ng/mL

## 2014-07-19 LAB — BASIC METABOLIC PANEL
ANION GAP: 18 — AB (ref 5–15)
BUN: 32 mg/dL — ABNORMAL HIGH (ref 6–23)
CHLORIDE: 107 meq/L (ref 96–112)
CO2: 21 mEq/L (ref 19–32)
Calcium: 8.3 mg/dL — ABNORMAL LOW (ref 8.4–10.5)
Creatinine, Ser: 1.68 mg/dL — ABNORMAL HIGH (ref 0.50–1.10)
GFR calc Af Amer: 28 mL/min — ABNORMAL LOW (ref >90)
GFR calc non Af Amer: 24 mL/min — ABNORMAL LOW (ref >90)
Glucose, Bld: 221 mg/dL — ABNORMAL HIGH (ref 70–99)
POTASSIUM: 5.1 meq/L (ref 3.7–5.3)
SODIUM: 146 meq/L (ref 137–147)

## 2014-07-19 LAB — I-STAT TROPONIN, ED: Troponin i, poc: 0.13 ng/mL (ref 0.00–0.08)

## 2014-07-19 LAB — INFLUENZA PANEL BY PCR (TYPE A & B)
H1N1FLUPCR: NOT DETECTED
INFLAPCR: NEGATIVE
Influenza B By PCR: NEGATIVE

## 2014-07-19 LAB — MRSA PCR SCREENING: MRSA by PCR: NEGATIVE

## 2014-07-19 LAB — I-STAT CG4 LACTIC ACID, ED: LACTIC ACID, VENOUS: 1.43 mmol/L (ref 0.5–2.2)

## 2014-07-19 LAB — URIC ACID: URIC ACID, SERUM: 11.6 mg/dL — AB (ref 2.4–7.0)

## 2014-07-19 LAB — URINE MICROSCOPIC-ADD ON

## 2014-07-19 LAB — TROPONIN I: Troponin I: <0.3 ng/mL (ref <0.30)

## 2014-07-19 LAB — PRO B NATRIURETIC PEPTIDE: Pro B Natriuretic peptide (BNP): 50428 pg/mL — ABNORMAL HIGH (ref 0–450)

## 2014-07-19 MED ORDER — DEXTROSE 5 % IV SOLN: 2.0000 g | Freq: Three times a day (TID) | INTRAVENOUS | Status: DC

## 2014-07-19 MED ORDER — HEPARIN SODIUM (PORCINE) 5000 UNIT/ML IJ SOLN: 5000.0000 [IU] | Freq: Three times a day (TID) | INTRAMUSCULAR | Status: DC

## 2014-07-19 MED ORDER — ACETAMINOPHEN 325 MG PO TABS
650.0000 mg | ORAL_TABLET | ORAL | Status: DC | PRN
  Administered 2014-07-21: 650 mg via ORAL

## 2014-07-19 MED ORDER — ACETAMINOPHEN 650 MG RE SUPP
650.0000 mg | Freq: Once | RECTAL | Status: AC
  Administered 2014-07-19: 650 mg via RECTAL
  Filled 2014-07-19: qty 1

## 2014-07-19 MED ORDER — VANCOMYCIN HCL IN DEXTROSE 750-5 MG/150ML-% IV SOLN
750.0000 mg | INTRAVENOUS | Status: DC
  Filled 2014-07-19: qty 150

## 2014-07-19 MED ORDER — DEXTROSE 5 % IV SOLN
1.0000 g | Freq: Once | INTRAVENOUS | Status: AC
  Administered 2014-07-19: 1 g via INTRAVENOUS
  Filled 2014-07-19: qty 1

## 2014-07-19 MED ORDER — SODIUM CHLORIDE 0.9 % IV SOLN
INTRAVENOUS | Status: AC
  Administered 2014-07-19: 19:00:00 via INTRAVENOUS

## 2014-07-19 MED ORDER — VANCOMYCIN HCL IN DEXTROSE 1-5 GM/200ML-% IV SOLN
1000.0000 mg | Freq: Once | INTRAVENOUS | Status: AC
  Administered 2014-07-19: 1000 mg via INTRAVENOUS
  Filled 2014-07-19: qty 200

## 2014-07-19 MED ORDER — DONEPEZIL HCL 5 MG PO TABS
5.0000 mg | ORAL_TABLET | Freq: Every day | ORAL | Status: DC
  Administered 2014-07-19 – 2014-07-21 (×3): 5 mg via ORAL
  Filled 2014-07-19 (×4): qty 1

## 2014-07-19 MED ORDER — INSULIN ASPART 100 UNIT/ML ~~LOC~~ SOLN
0.0000 [IU] | Freq: Three times a day (TID) | SUBCUTANEOUS | Status: DC
  Administered 2014-07-20 – 2014-07-21 (×4): 2 [IU] via SUBCUTANEOUS
  Administered 2014-07-21: 3 [IU] via SUBCUTANEOUS
  Administered 2014-07-21 – 2014-07-22 (×3): 2 [IU] via SUBCUTANEOUS

## 2014-07-19 MED ORDER — POLYVINYL ALCOHOL 1.4 % OP SOLN
2.0000 [drp] | OPHTHALMIC | Status: DC | PRN
  Filled 2014-07-19: qty 15

## 2014-07-19 MED ORDER — ACETAMINOPHEN 325 MG PO TABS
650.0000 mg | ORAL_TABLET | Freq: Once | ORAL | Status: DC
  Filled 2014-07-19: qty 2

## 2014-07-19 MED ORDER — SIMVASTATIN 10 MG PO TABS
10.0000 mg | ORAL_TABLET | Freq: Every day | ORAL | Status: DC
  Administered 2014-07-19 – 2014-07-21 (×3): 10 mg via ORAL
  Filled 2014-07-19 (×4): qty 1

## 2014-07-19 MED ORDER — PROPRANOLOL HCL 40 MG PO TABS
40.0000 mg | ORAL_TABLET | Freq: Two times a day (BID) | ORAL | Status: DC
  Administered 2014-07-21 – 2014-07-22 (×3): 40 mg via ORAL
  Filled 2014-07-19 (×7): qty 1

## 2014-07-19 MED ORDER — FUROSEMIDE 10 MG/ML IJ SOLN
20.0000 mg | Freq: Once | INTRAMUSCULAR | Status: AC
  Administered 2014-07-19: 20 mg via INTRAVENOUS
  Filled 2014-07-19: qty 2

## 2014-07-19 MED ORDER — DEXTROSE 5 % IV SOLN
500.0000 mg | Freq: Three times a day (TID) | INTRAVENOUS | Status: DC
  Administered 2014-07-19 – 2014-07-20 (×2): 500 mg via INTRAVENOUS
  Filled 2014-07-19 (×6): qty 0.5

## 2014-07-19 NOTE — Progress Notes (Addendum)
Paged MD oncall for blood pressures being low when taken in the leg.  MD ordered to monitor as the patient is already getting fluids at 95mL/hr.   Fluids have been ordered for 135ml/hr now.  Will monitor blood pressure

## 2014-07-19 NOTE — Progress Notes (Signed)
ANTIBIOTIC CONSULT NOTE - INITIAL  Pharmacy Consult for Vancomycin/Aztreonam Indication: HCAP  Allergies  Allergen Reactions  . Cephalexin Other (See Comments)    Confusion/Altered mental status per family    Patient Measurements: Height:  57 inches Weight:  54.2 kg  Vital Signs: Temp: 100.3 F (37.9 C) (12/20 1310) Temp Source: Rectal (12/20 1310) BP: 132/53 mmHg (12/20 1310) Pulse Rate: 55 (12/20 1310)  Labs:  Recent Labs  07/19/14 1222  WBC 12.5*  HGB 10.1*  PLT 303  CREATININE 1.68*   Microbiology: Recent Results (from the past 720 hour(s))  Urine culture     Status: None   Collection Time: 06/30/14  2:30 AM  Result Value Ref Range Status   Specimen Description URINE, RANDOM  Final   Special Requests NONE  Final   Culture  Setup Time   Final    06/30/2014 08:43 Performed at [place]   Final    75,000 COLONIES/ML Performed at [facility]    Culture   Final    Multiple bacterial morphotypes present, none predominant. Suggest appropriate recollection if clinically indicated. Performed at [facility]    Report Status 07/01/2014 FINAL  Final  MRSA PCR Screening     Status: None   Collection Time: 06/30/14  7:16 PM  Result Value Ref Range Status   MRSA by PCR NEGATIVE NEGATIVE Final    Comment:        The GeneXpert MRSA Assay (FDA approved for NASAL specimens only), is one component of a comprehensive MRSA colonization surveillance program. It is not intended to diagnose MRSA infection nor to guide or monitor treatment for MRSA infections.   Culture, blood (routine x 2)     Status: None   Collection Time: 06/30/14 11:21 PM  Result Value Ref Range Status   Specimen Description BLOOD LEFT HAND  Final   Special Requests BOTTLES DRAWN AEROBIC ONLY [facility]  Final   Culture  Setup Time   Final    07/01/2014 05:25 Performed at [place]   Final    NO GROWTH 5 DAYS Performed at [facility]    Report Status 07/07/2014 FINAL  Final  Culture, blood (routine x 2)     Status: None   Collection Time: 06/30/14 11:37 PM  Result Value Ref Range Status   Specimen Description BLOOD RIGHT HAND  Final   Special Requests BOTTLES DRAWN AEROBIC ONLY 2[facility]  Final   Culture  Setup Time   Final    07/01/2014 05:25 Performed at [place]   Final    NO GROWTH 5 DAYS Performed at [facility]    Report Status 07/07/2014 FINAL  Final    Medical History: Past Medical History  Diagnosis Date  . Arthritis   . Cancer   . Diabetes mellitus without complication   . Cataract    Medications:  Anti-infectives    Start     Dose/Rate Route Frequency Ordered Stop   07/19/14 1500  vancomycin (VANCOCIN) IVPB 1000 mg/200 mL premix     1,000 mg200 mL/hr over 60 Minutes Intravenous  Once 07/19/14 1448     07/19/14 1445  aztreonam (AZACTAM) 1 g in dextrose 5 % 50 mL IVPB     1 g100 mL/hr over 30 Minutes Intravenous  Once 07/19/14 1443       Assessment: 80yo female admitted with resp. distress and decreased LOC  from [facility].  We have been asked to dose IV antibiotics with Aztreonam and Vancomycin for HCAP.  She has some renal insufficiency with creatinine of 1.68 and an estimated clearance ~ 24ml/min.  She has an allergy documented for Cephalexin which causes confusion per family.  She has a WBC of 12.5K, a BNP > 50,000.  Goal of Therapy:  Vancomycin trough level 15-20 mcg/ml  Plan:  1.  Vancomycin 1gm IV x 1 then 750mg  IV q48hr 2.  Aztreonam 1 gm IV x 1 then 500 mg q8hr 3.  Monitor renal function, clinical response and culture data. 4.  Obtain s/s Vancomycin trough as appropriate  Rober Minion, PharmD., MS Clinical Pharmacist Pager:  [phone] Thank you for allowing pharmacy to be part of this patients care team. 07/19/2014,3:16 PM

## 2014-07-19 NOTE — ED Notes (Signed)
Lab to add on Troponin to blood already in lab.

## 2014-07-19 NOTE — ED Notes (Signed)
NOTIFIED DR. Jeneen Rinks IN PERSON FOR PATIENTS PANIC LAB RESULTS OF TROPONIN =0.13 ng/ml @13 :05 pm ,07/19/2014.

## 2014-07-19 NOTE — ED Notes (Signed)
To room via EMS.  Pt coming from [place] home.  Onset around 7am pt was found to be short of breath, was placed on O2 @ 2L [place], Sp02 81%, EMS  Put pt on 3L [place] Sp02 did not change,  EMS put pt on CPAP for 8-10 min  SpO2 increased to 92% but LOC decreased, pt was bagged x 10min and at ED door was placed on non-rebreather.  O2 sats 99% in room.  Temporal temp 103.3.  Pt has bruising on right arm and fx at left humerus.

## 2014-07-19 NOTE — ED Provider Notes (Signed)
CSN: [id number]     Arrival date & time 07/19/14  1103 History   First MD Initiated Contact with Patient 07/19/14 1103     Chief Complaint  Patient presents with  . Shortness of Breath      HPI  Patient Richard evaluation from her rehabilitation facility with shortness of breath. Facility called out this morning that she was more short of breath.  Make throughout to find her dyspneic. Saturations in the low 80s on 2 L nasal cannula. Tried her on CPAP En route. She did not tolerate this. Presents here with saturations in the low 90s on 2-3 L nasal cannula. Rectal temperature 103. Awake. Able to answer simple questioning.  Vision admitted for tract infection approximately 2 weeks ago prior to going to rehabilitation facility. Was at home prior to that.  Past Medical History  Diagnosis Date  . Arthritis   . Cancer   . Diabetes mellitus without complication   . Cataract    Past Surgical History  Procedure Laterality Date  . Abdominal hysterectomy    . Eye surgery     History reviewed. No pertinent family history. History  Substance Use Topics  . Smoking status: Never Smoker   . Smokeless tobacco: Never Used  . Alcohol Use: No   OB History    No data available     Review of Systems  Unable to perform ROS: Dementia      Allergies  Cephalexin  Home Medications   Prior to Admission medications   Medication Sig Start Date End Date Taking? Authorizing Provider  acetaminophen (TYLENOL) 500 MG tablet Take 500 mg by mouth every 6 (six) hours as needed (for pain.).   Yes Historical Provider, MD  donepezil (ARICEPT) 5 MG tablet Take 5 mg by mouth at bedtime.  06/02/14  Yes Historical Provider, MD  glipiZIDE (GLUCOTROL) 5 MG tablet Take 5 mg by mouth daily.  03/23/13  Yes Historical Provider, MD  Polyethyl Glycol-Propyl Glycol (SYSTANE OP) Apply 1 drop to eye as needed (dry eyes).   Yes Historical Provider, MD  propranolol (INDERAL) 60 MG tablet Take 60 mg by mouth 2 (two)  times daily.   Yes Historical Provider, MD  simvastatin (ZOCOR) 10 MG tablet Take 10 mg by mouth daily.   Yes Historical Provider, MD  sitaGLIPtin (JANUVIA) 25 MG tablet Take 25 mg by mouth every other day.    Yes Historical Provider, MD  levofloxacin (LEVAQUIN) 250 MG tablet Take 1 tablet (250 mg total) by mouth daily. 07/06/14   Luan Moore, MD   BP 161/52 mmHg  Pulse 58  Temp(Src) 100.3 F (37.9 C) (Rectal)  Resp 29  Wt 119 lb 7.8 oz (54.2 kg)  SpO2 95% Physical Exam  Constitutional: No distress.  Thin and frail-appearing.  HENT:  Head: Normocephalic.  Eyes: Conjunctivae are normal. Pupils are equal, round, and reactive to light. No scleral icterus.  Neck: Normal range of motion. Neck supple. No thyromegaly present.  Cardiovascular: Normal rate and regular rhythm.  Exam reveals no gallop and no friction rub.   No murmur heard. Pulmonary/Chest: Effort normal and breath sounds normal. No respiratory distress. She has no wheezes. She has no rales.  Locally diminished basilar breath sounds. X-ray of the right. No prolongation.  Abdominal: Soft. Bowel sounds are normal. She exhibits no distension. There is no tenderness. There is no rebound.  Musculoskeletal: Normal range of motion.       Arms: Neurological: She is alert.  Moves all 4 extremities.  Skin: Skin is warm and dry. No rash noted.  Multiple areas of ecchymosis to right arm. Left arm. Bony prominences.  Psychiatric: She has a normal mood and affect. Her behavior is normal.    ED Course  Procedures (including critical care time) Labs Review Labs Reviewed  CBC WITH DIFFERENTIAL - Abnormal; Notable for the following:    WBC 12.5 (*)    RBC 3.37 (*)    Hemoglobin 10.1 (*)    HCT 32.9 (*)    Neutrophils Relative % 94 (*)    Neutro Abs 11.8 (*)    Lymphocytes Relative 4 (*)    Lymphs Abs 0.5 (*)    Monocytes Relative 2 (*)    All other components within normal limits  BASIC METABOLIC PANEL - Abnormal; Notable for the  following:    Glucose, Bld 221 (*)    BUN 32 (*)    Creatinine, Ser 1.68 (*)    Calcium 8.3 (*)    GFR calc non Af Amer 24 (*)    GFR calc Af Amer 28 (*)    Anion gap 18 (*)    All other components within normal limits  URINALYSIS, ROUTINE W REFLEX MICROSCOPIC - Abnormal; Notable for the following:    Hgb urine dipstick TRACE (*)    Protein, ur >300 (*)    All other components within normal limits  PRO B NATRIURETIC PEPTIDE - Abnormal; Notable for the following:    Pro B Natriuretic peptide (BNP) 50428.0 (*)    All other components within normal limits  I-STAT TROPOININ, ED - Abnormal; Notable for the following:    Troponin i, poc 0.13 (*)    All other components within normal limits  CULTURE, BLOOD (ROUTINE X 2)  CULTURE, BLOOD (ROUTINE X 2)  URINE CULTURE  TROPONIN I  URINE MICROSCOPIC-ADD ON  INFLUENZA PANEL BY PCR (TYPE A & B, H1N1)  PROCALCITONIN  I-STAT CG4 LACTIC ACID, ED    Imaging Review Dg Chest Port 1 View  07/19/2014   CLINICAL DATA:  Shortness of Breath  EXAM: PORTABLE CHEST - 1 VIEW  COMPARISON:  July 04, 2014  FINDINGS: There remains moderate interstitial edema with bibasilar consolidation. There are bilateral effusions with cardiomegaly. The pulmonary vascularity appears within normal limits. No adenopathy. There is evidence of old trauma involving the proximal left humeral metaphysis. There is a total shoulder replacement on the right.  IMPRESSION: Persistent congestive heart failure. Question superimposed pneumonia in both lower lobes. Both pneumonia and congestive heart failure may exist concurrently.   Electronically Signed   By: Lowella Grip M.D.   On: 07/19/2014 12:26     EKG Interpretation None      MDM   Final diagnoses:  Shortness of breath  Acute on chronic congestive heart failure, unspecified congestive heart failure type  Healthcare-associated pneumonia    Patient remained stable. Requiring only 2-3 L of nasal cannula. Saturations  range from 92-98. At times even as high as 99. However on 2 L would range in the mid 80s. Imaging suggests chronic congestive heart failure and possibility of new infiltrates. Pneumonia differential diagnostic consideration. Influenza pending. Blood cultures obtained. Treated for possible healthcare acquired pneumonia with history and exam, and vancomycin considering allergies in her recent hospitalization. Care discussed with internal medicine resident. Plan his ER evaluation for possible admission.    Tanna Furry, MD 07/19/14 [phone]

## 2014-07-19 NOTE — H&P (Signed)
Date: 07/19/2014               Patient Name:  Molly Ashley MRN: [id number]  DOB: [birth year] Age / Sex: 80 y.o., female   PCP: Rafaela M. Ruben Gottron, MD         Medical Service: Internal Medicine Teaching Service         Attending Physician: Dr. Aldine Contes, MD    First Contact: Dr. Raelene Bott Pager: [phone]  Second Contact: Dr. Redmond Pulling Pager: [phone]       After Hours (After 5p/  First Contact Pager: [phone]  weekends / holidays): Second Contact Pager: [phone]   Chief Complaint: SOB  History of Present Illness: Patient is a 80 year old female with a history of dementia, type 2 diabetes, coronary artery disease who presents to the emergency department for SOB. Pt was in her usual state last night and the facility staff at [facility] this morning noted that she was having difficulty swallowing food and having shortness of breath. She was on her home 2L O2 [place] and satting at 81% which remained unchanged with increasing O2 to 3L. EMS was then called. Per report pt was placed on CPAP which increased O2 sat to 92%. She was then eventually transitioned to a non re breather in the ED w/ O2 sat at 99%. Pt denies any pain other than from her sacral wounds and left humerus fracture. She denies difficulty breathing, dysuria, chest pain, leg swelling, fevers, sick contacts, and diarrhea. Pt's granddaughter is in the room and states pt is at her baseline regarding mental status. Pt believes she may have had one stent placed over 20 years ago which is when she was started on propanolol. Otherwise denies hx of heart failure. Pt has been on a puree diet at her nursing facility for the past 2-3 days secondary to dysphagia. This morning granddaughter states that the patient was having difficulty clearing her secretions.   Pt was recently admitted to [name] I[facility] on 12/1-4 fro acute encephalopathy secondary to a UTI. She was discharged home with cephalexin x 8 days (end date 07/07/14). She states that she completed her  course of abx.   Meds: Current Facility-Administered Medications  Medication Dose Route Frequency Provider Last Rate Last Dose  . acetaminophen (TYLENOL) tablet 650 mg  650 mg Oral Once Tanna Furry, MD   650 mg at 07/19/14 1134  . aztreonam (AZACTAM) 500 mg in dextrose 5 % 50 mL IVPB  500 mg Intravenous 3 times per day Tomasita Morrow, [facility]      . vancomycin (VANCOCIN) IVPB 1000 mg/200 mL premix  1,000 mg Intravenous Once Tomasita Morrow, [facility]      . [START ON 07/21/2014] vancomycin (VANCOCIN) IVPB 750 mg/150 ml premix  750 mg Intravenous Q48H Nita Denyse Dago, [facility]       Current Outpatient Prescriptions  Medication Sig Dispense Refill  . acetaminophen (TYLENOL) 500 MG tablet Take 500 mg by mouth every 6 (six) hours as needed (for pain.).    Marland Kitchen donepezil (ARICEPT) 5 MG tablet Take 5 mg by mouth at bedtime.   6  . glipiZIDE (GLUCOTROL) 5 MG tablet Take 5 mg by mouth daily.     Vladimir Faster Glycol-Propyl Glycol (SYSTANE OP) Apply 1 drop to eye as needed (dry eyes).    . propranolol (INDERAL) 60 MG tablet Take 60 mg by mouth 2 (two) times daily.    . simvastatin (ZOCOR) 10 MG tablet Take 10 mg by mouth daily.    Marland Kitchen  sitaGLIPtin (JANUVIA) 25 MG tablet Take 25 mg by mouth every other day.     . levofloxacin (LEVAQUIN) 250 MG tablet Take 1 tablet (250 mg total) by mouth daily. 1 tablet 0    Allergies: Allergies as of 07/19/2014 - Review Complete 07/19/2014  Allergen Reaction Noted  . Cephalexin Other (See Comments) 06/30/2014   Past Medical History  Diagnosis Date  . Arthritis   . Cancer   . Diabetes mellitus without complication   . Cataract    Past Surgical History  Procedure Laterality Date  . Abdominal hysterectomy    . Eye surgery     History reviewed. No pertinent family history. History   Social History  . Marital Status: Widowed    Spouse Name: N/A    Number of Children: N/A  . Years of Education: N/A   Occupational History  . Not on file.   Social History Main  Topics  . Smoking status: Never Smoker   . Smokeless tobacco: Never Used  . Alcohol Use: No  . Drug Use: No  . Sexual Activity: Not on file   Other Topics Concern  . Not on file   Social History Narrative    Review of Systems: Pertinent items are noted in HPI.  Physical Exam: Blood pressure 161/52, pulse 58, temperature 100.3 F (37.9 C), temperature source Rectal, resp. rate 29, weight 119 lb 7.8 oz (54.2 kg), SpO2 95 %. General: NAD, pleasant HEENT: symmetric smile, missing upper and lower dentition, EOMI, dry mucous membranes, 1 inch hematoma on left forehead near hairline, style left lower lid Lungs: CTAB, no wheezes or rales Cardiac: RRR, no murmurs GI: active bowel sounds, non tender to palpation, soft GU: negative for suprapubic tenderness Ext: multiple ecchymotic bruises on all 4 extremities, left arm swollen and tender to palpation, no pedal edema Neuro: alert and oriented x 3, CN 2-12 grossly intact. Moving all 4 extremities.   Lab results: Basic Metabolic Panel:  Recent Labs  07/19/14 1222  NA 146  K 5.1  CL 107  CO2 21  GLUCOSE 221*  BUN 32*  CREATININE 1.68*  CALCIUM 8.3*   CBC:  Recent Labs  07/19/14 1222  WBC 12.5*  NEUTROABS 11.8*  HGB 10.1*  HCT 32.9*  MCV 97.6  PLT 303   Cardiac Enzymes:  Recent Labs  07/19/14 1222  TROPONINI <0.30   BNP:  Recent Labs  07/19/14 1222  PROBNP 50428.0*   Urinalysis:  Recent Labs  07/19/14 1151  COLORURINE YELLOW  LABSPEC 1.014  PHURINE 5.5  GLUCOSEU NEGATIVE  HGBUR TRACE*  BILIRUBINUR NEGATIVE  KETONESUR NEGATIVE  PROTEINUR >300*  UROBILINOGEN 0.2  NITRITE NEGATIVE  LEUKOCYTESUR NEGATIVE    Imaging results:  Dg Chest Port 1 View  07/19/2014   CLINICAL DATA:  Shortness of Breath  EXAM: PORTABLE CHEST - 1 VIEW  COMPARISON:  July 04, 2014  FINDINGS: There remains moderate interstitial edema with bibasilar consolidation. There are bilateral effusions with cardiomegaly. The  pulmonary vascularity appears within normal limits. No adenopathy. There is evidence of old trauma involving the proximal left humeral metaphysis. There is a total shoulder replacement on the right.  IMPRESSION: Persistent congestive heart failure. Question superimposed pneumonia in both lower lobes. Both pneumonia and congestive heart failure may exist concurrently.   Electronically Signed   By: Lowella Grip M.D.   On: 07/19/2014 12:26    Other results: EKG: sinus rhythm, T wave inversion in leads I, II, V2-v5 where are new from previous EKG  on 12/2.   Assessment & Plan by Problem: Active Problems:   HCAP (healthcare-associated pneumonia)  HCAP-- pt was recently admitted on 12/1-4 for AMS 2/2 UTI. Pt is febrile w/ rectal temp of 103.0 deg F, WBC 12.5 and respirations 29. She meets 3/4 SIRS criteria. Pro-calcitonin elevated at 4.08. Influenza panel negative. Lactic acid WNL, BNP [id number] w/ no baseline to compare to, however on exam no signs of fluid overload were noted. Satting at 98% on 2L [place]. She was given IV lasix 20mg  once and started on vanc IV in the ED.  - started aztreonam and continued vanc w/ pharm dosing - blood cultures x 2 pending - UA negative, urine culture pending - IVFs at 75cc/ hr - PT/OT consulted - will check pulse ox while ambulating  Dementia:  - continue home Aricept 5mg  qhs  CAD s/p stent - decreased home imdur 60mg  BID to 40mg  2/2 to bradycardia ( HR around 55's)  Hyperlipidemia: - continue home Zocor 10 mg daily at bedtime   Type 2 diabetes: hemoglobin A1c's 7.3 on 06/30/14  - Sliding-scale insulin (sensitive) - Hold home Januvia and glipizide  Anion gap acidosis: Bicarbonate 21, anion gap 18. No ketones in the urine. Likely 2/2 to starvation ketosis. Pt has had decrease po intake, states she does not like her pureed diet at the nursing facility.  - IVF at 75 cc/hr - Continue to monitor, repeat BMET in the morning  Decubitus ulcers - wound care  consulted  Diet: NPO until passes bedside swallow study, dysphagia precautions. Can resume pureed diet once passes beside study.   Prophylaxis: SCDs  Code: DNR/DNI, pt has paperwork with her.   Dispo: Disposition is deferred at this time, awaiting improvement of current medical problems. Anticipated discharge in approximately 2-3 day(s).   The patient does have a current PCP (Rafaela M. Ruben Gottron, MD) and does not need an [facility] hospital follow-up appointment after discharge.  The patient does not have transportation limitations that hinder transportation to clinic appointments.  Signed: Julious Oka, MD 07/19/2014, 4:48 PM

## 2014-07-20 DIAGNOSIS — L8990 Pressure ulcer of unspecified site, unspecified stage: Secondary | ICD-10-CM

## 2014-07-20 DIAGNOSIS — I2510 Atherosclerotic heart disease of native coronary artery without angina pectoris: Secondary | ICD-10-CM

## 2014-07-20 DIAGNOSIS — E785 Hyperlipidemia, unspecified: Secondary | ICD-10-CM

## 2014-07-20 DIAGNOSIS — F0390 Unspecified dementia without behavioral disturbance: Secondary | ICD-10-CM

## 2014-07-20 DIAGNOSIS — R001 Bradycardia, unspecified: Secondary | ICD-10-CM

## 2014-07-20 DIAGNOSIS — J189 Pneumonia, unspecified organism: Principal | ICD-10-CM

## 2014-07-20 DIAGNOSIS — E119 Type 2 diabetes mellitus without complications: Secondary | ICD-10-CM

## 2014-07-20 DIAGNOSIS — N179 Acute kidney failure, unspecified: Secondary | ICD-10-CM

## 2014-07-20 DIAGNOSIS — E872 Acidosis: Secondary | ICD-10-CM

## 2014-07-20 LAB — URINE CULTURE
CULTURE: NO GROWTH
Colony Count: NO GROWTH

## 2014-07-20 LAB — GLUCOSE, CAPILLARY
GLUCOSE-CAPILLARY: 164 mg/dL — AB (ref 70–99)
GLUCOSE-CAPILLARY: 152 mg/dL — AB (ref 70–99)
Glucose-Capillary: 180 mg/dL — ABNORMAL HIGH (ref 70–99)
Glucose-Capillary: 162 mg/dL — ABNORMAL HIGH (ref 70–99)

## 2014-07-20 LAB — STREP PNEUMONIAE URINARY ANTIGEN: Strep Pneumo Urinary Antigen: NEGATIVE

## 2014-07-20 LAB — TROPONIN I

## 2014-07-20 MED ORDER — ALBUTEROL SULFATE (2.5 MG/3ML) 0.083% IN NEBU: 2.5000 mg | INHALATION_SOLUTION | RESPIRATORY_TRACT | Status: DC | PRN

## 2014-07-20 MED ORDER — ENSURE COMPLETE PO LIQD
237.0000 mL | Freq: Two times a day (BID) | ORAL | Status: DC
  Administered 2014-07-20 – 2014-07-22 (×4): 237 mL via ORAL

## 2014-07-20 MED ORDER — CETYLPYRIDINIUM CHLORIDE 0.05 % MT LIQD
7.0000 mL | Freq: Two times a day (BID) | OROMUCOSAL | Status: DC
  Administered 2014-07-21 – 2014-07-22 (×3): 7 mL via OROMUCOSAL

## 2014-07-20 MED ORDER — DEXTROSE 5 % IV SOLN
500.0000 mg | INTRAVENOUS | Status: DC
  Administered 2014-07-20 – 2014-07-21 (×2): 500 mg via INTRAVENOUS
  Filled 2014-07-20 (×3): qty 0.5

## 2014-07-20 NOTE — Progress Notes (Signed)
MD notified of maintenance fluids expiration.  MD does not wish to restart fluids at this time.  IV saline locked.  Will continue to monitor. Molly Ashley

## 2014-07-20 NOTE — Progress Notes (Signed)
Subjective:  Patient reports that she is doing well this morning and is not short of breath. Patient denies coughing up anything. Patient has no other complaints this morning.   Objective: Vital signs in last 24 hours: Filed Vitals:   07/19/14 2255 07/20/14 0218 07/20/14 0251 07/20/14 0526  BP: 108/41 132/111 113/30 141/33  Pulse:  54 55 58  Temp:  98.1 F (36.7 C)  98.7 F (37.1 C)  TempSrc:  Oral  Oral  Resp:  21  22  Height:      Weight:    120 lb 13 oz (54.8 kg)  SpO2:  97%  97%   Weight change:   Intake/Output Summary (Last 24 hours) at 07/20/14 0854 Last data filed at 07/20/14 8563  Gross per 24 hour  Intake 1803.75 ml  Output   1525 ml  Net 278.75 ml   General: resting in bed HEENT: PERRL, EOMI, no scleral icterus Cardiac: RRR, no rubs, murmurs or gallops Pulm: Diffuse rhonchi bilaterally, moving normal volumes of air Abd: soft, nontender, nondistended, BS present Ext: warm and well perfused, no pedal edema Neuro: alert and oriented X3, cranial nerves II-XII grossly intact Skin: no rashes or lesions noted Psych: appropriate affect  Lab Results: Basic Metabolic Panel:  Recent Labs Lab 07/19/14 1222  NA 146  K 5.1  CL 107  CO2 21  GLUCOSE 221*  BUN 32*  CREATININE 1.68*  CALCIUM 8.3*   Liver Function Tests: No results for input(s): AST, ALT, ALKPHOS, BILITOT, PROT, ALBUMIN in the last 168 hours. No results for input(s): LIPASE, AMYLASE in the last 168 hours. No results for input(s): AMMONIA in the last 168 hours. CBC:  Recent Labs Lab 07/19/14 1222  WBC 12.5*  NEUTROABS 11.8*  HGB 10.1*  HCT 32.9*  MCV 97.6  PLT 303   Cardiac Enzymes:  Recent Labs Lab 07/19/14 1222  TROPONINI <0.30   BNP:  Recent Labs Lab 07/19/14 1222  PROBNP 50428.0*   D-Dimer: No results for input(s): DDIMER in the last 168 hours. CBG:  Recent Labs Lab 07/19/14 1814 07/19/14 2135 07/20/14 0634  GLUCAP 238* 292* 164*   Hemoglobin A1C: No  results for input(s): HGBA1C in the last 168 hours. Fasting Lipid Panel: No results for input(s): CHOL, HDL, LDLCALC, TRIG, CHOLHDL, LDLDIRECT in the last 168 hours. Thyroid Function Tests: No results for input(s): TSH, T4TOTAL, FREET4, T3FREE, THYROIDAB in the last 168 hours. Coagulation: No results for input(s): LABPROT, INR in the last 168 hours. Anemia Panel: No results for input(s): VITAMINB12, FOLATE, FERRITIN, TIBC, IRON, RETICCTPCT in the last 168 hours. Urine Drug Screen: Drugs of Abuse  No results found for: LABOPIA, COCAINSCRNUR, LABBENZ, AMPHETMU, THCU, LABBARB  Alcohol Level: No results for input(s): ETH in the last 168 hours. Urinalysis:  Recent Labs Lab 07/19/14 1151  COLORURINE YELLOW  LABSPEC 1.014  PHURINE 5.5  GLUCOSEU NEGATIVE  HGBUR TRACE*  BILIRUBINUR NEGATIVE  KETONESUR NEGATIVE  PROTEINUR >300*  UROBILINOGEN 0.2  NITRITE NEGATIVE  LEUKOCYTESUR NEGATIVE   Micro Results: Recent Results (from the past 240 hour(s))  MRSA PCR Screening     Status: None   Collection Time: 07/19/14  6:37 PM  Result Value Ref Range Status   MRSA by PCR NEGATIVE NEGATIVE Final    Comment:        The GeneXpert MRSA Assay (FDA approved for NASAL specimens only), is one component of a comprehensive MRSA colonization surveillance program. It is not intended to diagnose MRSA infection nor to guide  or monitor treatment for MRSA infections.    Studies/Results: Dg Chest Port 1 View  07/19/2014   CLINICAL DATA:  Shortness of Breath  EXAM: PORTABLE CHEST - 1 VIEW  COMPARISON:  July 04, 2014  FINDINGS: There remains moderate interstitial edema with bibasilar consolidation. There are bilateral effusions with cardiomegaly. The pulmonary vascularity appears within normal limits. No adenopathy. There is evidence of old trauma involving the proximal left humeral metaphysis. There is a total shoulder replacement on the right.  IMPRESSION: Persistent congestive heart failure.  Question superimposed pneumonia in both lower lobes. Both pneumonia and congestive heart failure may exist concurrently.   Electronically Signed   By: Lowella Grip M.D.   On: 07/19/2014 12:26   Medications: I have reviewed the patient's current medications. Scheduled Meds: . [START ON 07/21/2014] antiseptic oral rinse  7 mL Mouth Rinse BID  . aztreonam  500 mg Intravenous 3 times per day  . donepezil  5 mg Oral QHS  . insulin aspart  0-9 Units Subcutaneous TID WC  . propranolol  40 mg Oral BID  . simvastatin  10 mg Oral q1800  . [START ON 07/21/2014] vancomycin  750 mg Intravenous Q48H   Continuous Infusions:  PRN Meds:.acetaminophen, polyvinyl alcohol Assessment/Plan: Principal Problem:   HCAP (healthcare-associated pneumonia) Active Problems:   AKI (acute kidney injury)  Patient is a 80 year old female with a history of dementia, coronary artery disease status post stenting, hyperlipidemia, type 2 diabetes, decubitus ulcers, recent shoulder trauma who presents with healthcare associated pneumonia.   HCAP: Patient has defervesced with no fevers since initial presentation. Patient had a rectal temperature of 103, white count of 12.5, and pro-calcitonin elevated at 4.08. ProBNP was also elevated at 50,000 with no baseline for comparison though no signs of fluid overload. Difficult blood draws this morning.  -Continue vancomycin (start date 07/19/2014) -Convert to cefipime from aztreonam (cephalexin allergy listed, though reaction is atypical, start date 07/19/2014) - blood cultures x 2 pending - UA negative, urine culture pending - PT/OT consulted  Dementia:  - continue home Aricept 5mg  qhs  CAD s/p stent - decreased home propanolol from 60mg  BID to 40mg  2/2 to bradycardia ( HR around 55's)  Hyperlipidemia: - continue home Zocor 10 mg daily at bedtime   Type 2 diabetes: hemoglobin A1c's 7.3 on 06/30/14. - Sliding-scale insulin (sensitive) - Hold home Januvia and  glipizide  Anion gap acidosis: Bicarbonate 21, anion gap 18. No ketones in the urine. Likely 2/2 to starvation ketosis. Pt has had decrease po intake, states she does not like her pureed diet at the nursing facility.  - Continue to monitor  Decubitus ulcers - wound care consulted  Diet: Dysphagia 1 Prophylaxis: SCDs  Code: DNR/DNI, pt has paperwork with her.   Dispo: Disposition is deferred at this time, awaiting improvement of current medical problems. Anticipated discharge in approximately 2-3 day(s).   The patient does have a current PCP (Rafaela M. Ruben Gottron, MD) and does not need an [facility] hospital follow-up appointment after discharge.  The patient does not have transportation limitations that hinder transportation to clinic appointments.  .Services Needed at time of discharge: Y = Yes, Blank = No PT:   OT:   RN:   Equipment:   Other:     LOS: 1 day   Luan Moore, MD 07/20/2014, 8:54 AM

## 2014-07-20 NOTE — Consult Note (Signed)
Spoke with bedside nurse, Stage I pressure ulcer on the sacrum.  Foam dressing implemented, appropriate plan of care for this patient.    Re consult if needed, will not follow at this time. Thanks  Shaarav Ripple [place], [place] [phone])

## 2014-07-20 NOTE — Evaluation (Signed)
Physical Therapy Evaluation Patient Details Name: Molly Ashley MRN: [id number] DOB: 08/31/1912 Today's Date: 07/20/2014   History of Present Illness  80 year old female with a history of dementia, type 2 diabetes, coronary artery disease who presents to the emergency department for SOB. Pt was recently admitted to [facility] IMTS on 12/1-4 fro acute encephalopathy secondary to a UTI.  OF NOTE: Patient with hx of humerus fx Left UE.  Clinical Impression  Patient demonstrates deficits in functional mobility as indicated below. Will need continued skilled PT to address deficits and maximize function. Will see as indicated and progress as tolerated. Recommend continued skilled PT at SNF upon discharge.    Follow Up Recommendations SNF    Equipment Recommendations  Other (comment) (defer to SNF.)    Recommendations for Other Services       Precautions / Restrictions Precautions Precautions: Fall Precaution Comments: L humerus fx. No precautions in chart. Restrictions Weight Bearing Restrictions: No      Mobility  Bed Mobility Overal bed mobility: Needs Assistance Bed Mobility: Supine to Sit     Supine to sit: Max assist;HOB elevated     General bed mobility comments: Patient able to initiate LE movement to EOB, required max assist to elevate trunk to upright, and assist to return to supine.   Transfers Overall transfer level: Needs assistance Equipment used:  (body supported by therapist) Transfers: Sit to/from Stand Sit to Stand: Max assist         General transfer comment: max assist for support and stability during standing activity.   Ambulation/Gait                Stairs            Wheelchair Mobility    Modified Rankin (Stroke Patients Only)       Balance Overall balance assessment: Needs assistance Sitting-balance support: Single extremity supported;Feet supported Sitting balance-Leahy Scale: Fair Sitting balance - Comments: tolerated EOB >10  minutes   Standing balance support: During functional activity Standing balance-Leahy Scale: Poor Standing balance comment: Assist for stability required, fatigues quickly                             Pertinent Vitals/Pain Pain Assessment: Faces Faces Pain Scale: Hurts a little bit Pain Intervention(s): Monitored during session;Limited activity within patient's tolerance;Repositioned    Home Living Family/patient expects to be discharged to:: Skilled nursing facility                      Prior Function                 Hand Dominance   Dominant Hand: Right    Extremity/Trunk Assessment   Upper Extremity Assessment: Defer to OT evaluation           Lower Extremity Assessment: Generalized weakness         Communication   Communication: HOH  Cognition Arousal/Alertness: Awake/alert Behavior During Therapy: WFL for tasks assessed/performed Overall Cognitive Status: History of cognitive impairments - at baseline                      General Comments General comments (skin integrity, edema, etc.): VS assessed SpO2 90% on 3 liters with HR 39, assisted with slef care for leaking IVs and weeping arms secondary to blood drawl attempts from IV team prior to session.     Exercises General Exercises - Lower Extremity Ankle  Circles/Pumps: AROM;Both;20 reps Long Arc Quad: AROM;Both;20 reps Hip Flexion/Marching: AROM;Both;20 reps (required physical assist to maintain balance during exercise)      Assessment/Plan    PT Assessment Patient needs continued PT services  PT Diagnosis Difficulty walking;Generalized weakness;Acute pain   PT Problem List Decreased strength;Decreased activity tolerance;Decreased balance;Decreased mobility;Decreased knowledge of use of DME;Decreased knowledge of precautions;Pain;Decreased skin integrity  PT Treatment Interventions DME instruction;Gait training;Functional mobility training;Therapeutic  activities;Therapeutic exercise;Balance training;Patient/family education   PT Goals (Current goals can be found in the Care Plan section) Acute Rehab PT Goals Patient Stated Goal: Pt wants to return to prior level of function. PT Goal Formulation: With patient Time For Goal Achievement: 07/31/14 Potential to Achieve Goals: Good    Frequency Min 2X/week   Barriers to discharge        Co-evaluation               End of Session Equipment Utilized During Treatment: Gait belt Activity Tolerance: Patient tolerated treatment well Patient left: in bed;with call bell/phone within reach Nurse Communication: Mobility status         Time: 0901-0927 PT Time Calculation (min) (ACUTE ONLY): 26 min   Charges:   PT Evaluation $Initial PT Evaluation Tier I: 1 Procedure PT Treatments $Therapeutic Exercise: 8-22 mins $Therapeutic Activity: 8-22 mins   PT G CodesDuncan Dull 07/20/2014, 9:44 AM  Alben Deeds, PT DPT  [phone]

## 2014-07-20 NOTE — Progress Notes (Signed)
INITIAL NUTRITION ASSESSMENT  DOCUMENTATION CODES Per approved criteria  -Not Applicable   INTERVENTION: 1.  General healthful diet; encourage intake of foods and beverages as able.  RD to follow and assess for nutritional adequacy.  2.  Modify diet; consider advancing diet to regular with thin liquids.  Patient states that she was eating soup and other reguarly textured foods at home PTA (chicken thigh, green beans, etc..). Paged MD with request 3.  Supplements; Ensure Complete po BID, each supplement provides 350 kcal and 13 grams of protein  NUTRITION DIAGNOSIS: Inadquate oral intake related to dislike of D1 foods as evidenced by patient and family report.   Monitor:  1.  Food/Beverage; pt meeting >/=90% estimated needs with tolerance. 2.  Wt/wt change; monitor trends   Reason for Assessment: Low Braden  80 y.o. female  Admitting Dx: HCAP (healthcare-associated pneumonia)  ASSESSMENT: Patient admitted with healthcare associated pneumonia.  Patient with weeping fluid blisters on arms.  RD met with patient and granddaughter at bedside who state patient was eating welll and per her usually until a few days ago.  Patient states that she is getting pureed foods in the hospital which she does not like and it not able to eat- aside from applesauce and mashed potatoes.  Granddaughter states that she was on a pureed diet at one time, but that patient "only chokes every once in a while, not a regular thing.  She does really good."  Patient was advanced to regular diet prior to discharge at most recent visit earlier in December.  Patient states she does like Ensure and is agreeable to drinking while in the hospital. Per patient request, D will page MD re: diet advancement to regular texture foods.   Height: Ht Readings from Last 1 Encounters:  07/19/14 _0  (1.448 m)    Weight: Wt Readings from Last 1 Encounters:  07/20/14 120 lb 13 oz (54.8 kg)    Ideal Body Weight: ~95 lbs  %  Ideal Body Weight: 126%  Wt Readings from Last 10 Encounters:  07/20/14 120 lb 13 oz (54.8 kg)  06/30/14 119 lb 7.8 oz (54.2 kg)  06/24/14 119 lb (53.978 kg)  09/29/12 121 lb (54.885 kg)    Usual Body Weight: 120 lbs  % Usual Body Weight: 100%  BMI:  Body mass index is 26.14 kg/(m^2).  Estimated Nutritional Needs: Kcal: 1400-1500 Protein: 50-60g Fluid: >1.5 L/day  Skin: weeping fluid blisters  Diet Order: DIET - DYS 1  EDUCATION NEEDS: -Education needs addressed   Intake/Output Summary (Last 24 hours) at 07/20/14 1250 Last data filed at 07/20/14 5053  Gross per 24 hour  Intake 1803.75 ml  Output   1525 ml  Net 278.75 ml    Last BM: PTA   Labs:   Recent Labs Lab 07/19/14 1222  NA 146  K 5.1  CL 107  CO2 21  BUN 32*  CREATININE 1.68*  CALCIUM 8.3*  GLUCOSE 221*    CBG (last 3)   Recent Labs  07/19/14 2135 07/20/14 0634 07/20/14 1151  GLUCAP 292* 164* 180*    Scheduled Meds: . [START ON 07/21/2014] antiseptic oral rinse  7 mL Mouth Rinse BID  . ceFEPime (MAXIPIME) IV  500 mg Intravenous Q24H  . donepezil  5 mg Oral QHS  . insulin aspart  0-9 Units Subcutaneous TID WC  . propranolol  40 mg Oral BID  . simvastatin  10 mg Oral q1800  . [START ON 07/21/2014] vancomycin  750 mg Intravenous  Q48H    Continuous Infusions:   Past Medical History  Diagnosis Date  . Arthritis   . Cancer   . Diabetes mellitus without complication   . Cataract     Past Surgical History  Procedure Laterality Date  . Abdominal hysterectomy    . Eye surgery      Brynda Greathouse, MS RD LDN Clinical Inpatient Dietitian Weekend/After hours pager: [phone]

## 2014-07-20 NOTE — Progress Notes (Signed)
OT Cancellation Note  Patient Details Name: Molly Ashley MRN: [id number] DOB: August 21, 1912   Cancelled Treatment:    Reason Eval/Treat Not Completed: OT screened. Pt is from SNF current D/C plan is to return to SNF. No apparent immediate acute care OT needs, therefore will defer OT to SNF. If OT eval is needed please call Acute Rehab Dept. at [phone] or text page OT at [phone].    Benito Mccreedy OTR/L [phone]  07/20/2014, 10:31 AM

## 2014-07-20 NOTE — Progress Notes (Signed)
MD made aware of 3 phlebotomists unable to draw blood for ordered labs.  MD gave verbal ok to d/c ordered labs as well as continuous pulse ox.  Will carry out orders and continue to monitor. Graceann Congress

## 2014-07-20 NOTE — Progress Notes (Signed)
Patient's bladder scanned and bladder scanner revealed greater than 250cc of urine; MD notified and orders given to In and Out cath.  After In and Out cath, there was 250cc of urine drained from bladder.  Ruben Im RN

## 2014-07-20 NOTE — Care Management Utilization Note (Signed)
UR completed.    Tonae Livolsi Wise Annalena Piatt, RN, BSN Phone #[phone]  

## 2014-07-20 NOTE — Progress Notes (Signed)
ANTIBIOTIC CONSULT NOTE - INITIAL  Pharmacy Consult for Cefepime Indication: HCAP  Allergies  Allergen Reactions  . Cephalexin Other (See Comments)    Confusion/Altered mental status per family     Patient Measurements: Height: 4\' 9"  (144.8 cm) Weight: 120 lb 13 oz (54.8 kg) IBW/kg (Calculated) : 38.6 Adjusted Body Weight:    Vital Signs: Temp: 98.7 F (37.1 C) (12/21 0526) Temp Source: Oral (12/21 0526) BP: 141/33 mmHg (12/21 0526) Pulse Rate: 45 (12/21 0940) Intake/Output from previous day: 12/20 0701 - 12/21 0700 In: 1154.2 [P.O.:130; I.V.:924.2; IV Piggyback:100] Out: 0  Intake/Output from this shift: Total I/O In: 649.6 [P.O.:60; I.V.:589.6] Out: 1525 [Urine:1525]  Labs:  Recent Labs  07/19/14 1222  WBC 12.5*  HGB 10.1*  PLT 303  CREATININE 1.68*   Estimated Creatinine Clearance: 13 mL/min (by C-G formula based on Cr of 1.68). No results for input(s): VANCOTROUGH, VANCOPEAK, VANCORANDOM, GENTTROUGH, GENTPEAK, GENTRANDOM, TOBRATROUGH, TOBRAPEAK, TOBRARND, AMIKACINPEAK, AMIKACINTROU, AMIKACIN in the last 72 hours.   Microbiology: Recent Results (from the past 720 hour(s))  Urine culture     Status: None   Collection Time: 06/30/14  2:30 AM  Result Value Ref Range Status   Specimen Description URINE, RANDOM  Final   Special Requests NONE  Final   Culture  Setup Time   Final    06/30/2014 08:43 Performed at [place]   Final    75,000 COLONIES/ML Performed at [facility]    Culture   Final    Multiple bacterial morphotypes present, none predominant. Suggest appropriate recollection if clinically indicated. Performed at [facility]    Report Status 07/01/2014 FINAL  Final  MRSA PCR Screening     Status: None   Collection Time: 06/30/14  7:16 PM  Result Value Ref Range Status   MRSA by PCR NEGATIVE NEGATIVE Final    Comment:        The GeneXpert MRSA Assay (FDA approved for NASAL specimens only), is  one component of a comprehensive MRSA colonization surveillance program. It is not intended to diagnose MRSA infection nor to guide or monitor treatment for MRSA infections.   Culture, blood (routine x 2)     Status: None   Collection Time: 06/30/14 11:21 PM  Result Value Ref Range Status   Specimen Description BLOOD LEFT HAND  Final   Special Requests BOTTLES DRAWN AEROBIC ONLY [facility]  Final   Culture  Setup Time   Final    07/01/2014 05:25 Performed at [place]   Final    NO GROWTH 5 DAYS Performed at [facility]    Report Status 07/07/2014 FINAL  Final  Culture, blood (routine x 2)     Status: None   Collection Time: 06/30/14 11:37 PM  Result Value Ref Range Status   Specimen Description BLOOD RIGHT HAND  Final   Special Requests BOTTLES DRAWN AEROBIC ONLY 2CC  Final   Culture  Setup Time   Final    07/01/2014 05:25 Performed at [place]   Final    NO GROWTH 5 DAYS Performed at [facility]    Report Status 07/07/2014 FINAL  Final  Culture, blood (routine x 2)     Status: None (Preliminary result)   Collection Time: 07/19/14 12:25 PM  Result Value Ref Range Status   Specimen Description BLOOD RIGHT HAND  Final   Special Requests BOTTLES DRAWN AEROBIC  AND ANAEROBIC 5 CC  Final   Culture  Setup Time   Final    07/19/2014 18:32 Performed at [facility]    Culture   Final           BLOOD CULTURE RECEIVED NO GROWTH TO DATE CULTURE WILL BE HELD FOR 5 DAYS BEFORE ISSUING A FINAL NEGATIVE REPORT Performed at [facility]    Report Status PENDING  Incomplete  Culture, blood (routine x 2)     Status: None (Preliminary result)   Collection Time: 07/19/14 12:25 PM  Result Value Ref Range Status   Specimen Description BLOOD LEFT HAND  Final   Special Requests BOTTLES DRAWN AEROBIC AND ANAEROBIC 5ML  Final   Culture  Setup Time   Final    07/19/2014 18:32 Performed at [facility]     Culture   Final           BLOOD CULTURE RECEIVED NO GROWTH TO DATE CULTURE WILL BE HELD FOR 5 DAYS BEFORE ISSUING A FINAL NEGATIVE REPORT Performed at [facility]    Report Status PENDING  Incomplete  MRSA PCR Screening     Status: None   Collection Time: 07/19/14  6:37 PM  Result Value Ref Range Status   MRSA by PCR NEGATIVE NEGATIVE Final    Comment:        The GeneXpert MRSA Assay (FDA approved for NASAL specimens only), is one component of a comprehensive MRSA colonization surveillance program. It is not intended to diagnose MRSA infection nor to guide or monitor treatment for MRSA infections.     Medical History: Past Medical History  Diagnosis Date  . Arthritis   . Cancer   . Diabetes mellitus without complication   . Cataract    Assessment: 80yo female admitted with resp. distress and decreased LOC from [facility].She has an allergy documented for Cephalexin which causes confusion per family. Abx for HCAP.  Anticoagulation: SCDs ordered.  Infectious Disease: HCAP. Tmax 101.6. WBC 12.5. Cr 1.68 (CrCl 13). Vanco 12/20>> Aztreonam 12/20>>12/21 Cefepime 12/21>>  12/20: BC x 2  Cardiovascular: CAD, stents, CABG, HLD, BP 141/33, HR 45 on Propranolol, Zocor. ProBNP>50,000 with no signs of fluid overload.   Endocrinology: DM on SSI with glucoses 238,292,164,180 (off glipizide and Januvia)  Gastrointestinal / Nutrition:   Neurology: Dementia on Aricept  Nephrology: Scr 1.68 with CrCl 13  Pulmonary: 97% on 2L  Hematology / Oncology: Hgb 10  PTA Medication Issues: Glipizide, Januvia  Best Practices  Goal of Therapy:  Vancomycin trough level 15-20 mcg/ml  Plan:  Continue Vancomycin 750mg  IV q48h Cefepime 500mg  IV q24h.   Teleshia Lemere S. Alford Highland, PharmD, BCPS Clinical Staff Pharmacist Pager [phone]  Eilene Ghazi Stillinger 07/20/2014,12:25 PM

## 2014-07-20 NOTE — Progress Notes (Signed)
Patient transferred to air lay bed for pressure prevention.  New sacral dressing in place.  Arms are weeping from fluid blisters so we wrapped green pads around to control fluid leakage.  Will continue to monitor.

## 2014-07-20 NOTE — Progress Notes (Signed)
In and out cath to get urine sample and got out 1525 mL of urine.

## 2014-07-20 NOTE — Progress Notes (Signed)
MD notified of pt's low HR.  Propanolol held per MD verbal order.  Will continue to monitor.  No new orders at this time. Will continue to monitor. Graceann Congress

## 2014-07-20 NOTE — Progress Notes (Signed)
Pt has not urinated since this morning but has also not been drinking adequate fluids.  Bladder scan reveals 191mL.  Pt encouraged to drink fluids.  Will continue to monitor. Graceann Congress

## 2014-07-20 NOTE — Care Management Note (Signed)
    Page 1 of 1   07/23/2014     1:52:45 PM CARE MANAGEMENT NOTE 07/23/2014  Patient:  RHENDA, [place]   Account Number:  [id number]  Date Initiated:  07/20/2014  Documentation initiated by:  AMERSON,JULIE  Subjective/Objective Assessment:   Pt adm on 07/19/14 with HCAP.  PTA, pt resided at [facility].     Action/Plan:   CSW consulted to facilitate return to SNF when medically stable for dc.  Will follow progress.   Anticipated DC Date:  07/23/2014   Anticipated DC Plan:  SKILLED NURSING FACILITY  In-house referral  Clinical Social Worker      DC Planning Services  CM consult      Choice offered to / List presented to:             Status of service:  Completed, signed off Medicare Important Message given?  YES (If response is "NO", the following Medicare IM given date fields will be blank) Date Medicare IM given:  07/22/2014 Medicare IM given by:  Terral Cooks Date Additional Medicare IM given:   Additional Medicare IM given by:    Discharge Disposition:  [place]  Per UR Regulation:  Reviewed for med. necessity/level of care/duration of stay  If discussed at [place] of Stay Meetings, dates discussed:    Comments:  SNF/ [place]

## 2014-07-20 NOTE — Progress Notes (Signed)
Pt is refusing to eat at this time.  Pt given ensure but only wants a few sips right now.  Will continue to monitor and encourage oral intake. Molly Ashley

## 2014-07-20 NOTE — Progress Notes (Signed)
Cardiac monitoring d/c'd by MD. NT removed monitor. Graceann Congress

## 2014-07-21 ENCOUNTER — Other Ambulatory Visit: Payer: Self-pay

## 2014-07-21 LAB — TROPONIN I
Troponin I: 0.19 ng/mL (ref <0.30)
Troponin I: 0.15 ng/mL — ABNORMAL HIGH (ref <0.031)

## 2014-07-21 LAB — GLUCOSE, CAPILLARY
GLUCOSE-CAPILLARY: 206 mg/dL — AB (ref 70–99)
Glucose-Capillary: 194 mg/dL — ABNORMAL HIGH (ref 70–99)
Glucose-Capillary: 201 mg/dL — ABNORMAL HIGH (ref 70–99)
Glucose-Capillary: 187 mg/dL — ABNORMAL HIGH (ref 70–99)

## 2014-07-21 LAB — CBC
HCT: 33.7 % — ABNORMAL LOW (ref 36.0–46.0)
Hemoglobin: 10.3 g/dL — ABNORMAL LOW (ref 12.0–15.0)
MCH: 29.7 pg (ref 26.0–34.0)
MCHC: 30.6 g/dL (ref 30.0–36.0)
MCV: 97.1 fL (ref 78.0–100.0)
PLATELETS: 256 10*3/uL (ref 150–400)
RBC: 3.47 MIL/uL — ABNORMAL LOW (ref 3.87–5.11)
RDW: 14.8 % (ref 11.5–15.5)
WBC: 12.4 10*3/uL — AB (ref 4.0–10.5)

## 2014-07-21 LAB — LEGIONELLA ANTIGEN, URINE

## 2014-07-21 LAB — BASIC METABOLIC PANEL
Anion gap: 9 (ref 5–15)
BUN: 30 mg/dL — ABNORMAL HIGH (ref 6–23)
CALCIUM: 8.1 mg/dL — AB (ref 8.4–10.5)
CHLORIDE: 112 meq/L (ref 96–112)
CO2: 24 mmol/L (ref 19–32)
CREATININE: 1.67 mg/dL — AB (ref 0.50–1.10)
GFR calc non Af Amer: 24 mL/min — ABNORMAL LOW (ref >90)
GFR, EST AFRICAN AMERICAN: 28 mL/min — AB (ref >90)
Glucose, Bld: 226 mg/dL — ABNORMAL HIGH (ref 70–99)
Potassium: 4 mmol/L (ref 3.5–5.1)
Sodium: 145 mmol/L (ref 135–145)

## 2014-07-21 MED ORDER — DOXYCYCLINE HYCLATE 100 MG PO TABS
100.0000 mg | ORAL_TABLET | Freq: Two times a day (BID) | ORAL | Status: DC
  Administered 2014-07-21 – 2014-07-22 (×2): 100 mg via ORAL
  Filled 2014-07-21 (×3): qty 1

## 2014-07-21 MED ORDER — LEVOFLOXACIN 500 MG PO TABS: 500.0000 mg | ORAL_TABLET | ORAL | Status: DC

## 2014-07-21 MED ORDER — LEVOFLOXACIN IN D5W 750 MG/150ML IV SOLN
750.0000 mg | Freq: Once | INTRAVENOUS | Status: DC
  Filled 2014-07-21: qty 150

## 2014-07-21 MED ORDER — LEVOFLOXACIN 750 MG PO TABS
750.0000 mg | ORAL_TABLET | Freq: Once | ORAL | Status: AC
  Administered 2014-07-21: 750 mg via ORAL
  Filled 2014-07-21: qty 1

## 2014-07-21 NOTE — Progress Notes (Signed)
Rapid response helped me to put in the intermittent catheter.  Patient is very swollen and there have been numerous unsuccessful attempts.  Patient is in pain and distress each time the intermittent catheter has to be done.  Paged the MD earlier about putting in a urinary catheter.  MD said they really do not want to insert a foley.  Patient states she "wishes her daughter was here to see what is happening" to her. Will continue to monitor for urinary retention.

## 2014-07-21 NOTE — Progress Notes (Signed)
Pt seen and examined with Dr. Raelene Bott. Case d/w residents and plan formulated on morning rounds. Please refer to resident note for details  Pt feels well this AM. No CP, no SOB  Physical Exam: Gen: AAO*3, NAD CVS: RRR, normal heart sounds Pulm: b/l scattered rhonchi + but improved Abd: soft, non tender, BS + Ext: no edema  Assessment and Plan: 80 y/o female who p/w SOB, hypoxia found to have HCAP  HCAP: - Pt with no fevers today - Pt was noted to have b/l infiltrates on her chest x ray and procalcitonin of 4 with an elevated WBC all consistent with PNA - C/w cefipime and vancomycin for now - f/u blood cx - NGTD - Pt eval noted- recommend SNF on discharge  Dementia: - c/w aricept  Bradycardia: - would consider holding propanolol if recurrent bradycardia - EKG with deep t wave inversions consistent with ischemia in infero lateral leads - Troponins are negative. No further w/u for EKG changes at this time - Outpatient f/u with PCP   Pt is DNR/DNI

## 2014-07-21 NOTE — Progress Notes (Addendum)
Patient did not urinate all night.  Bladder scan showing 450 mL of urine.    Paged oncall internal med service.  Ordered to do another in and out catheter for now and not to foley cath patient at this time.  Will discuss with day time MDs.

## 2014-07-21 NOTE — Progress Notes (Addendum)
Subjective:  Patient reports that she is doing well this morning. Denies chest pain or shortness of breath. She reports constipation. Has not been ambulating.  Objective: Vital signs in last 24 hours: Filed Vitals:   07/20/14 1457 07/20/14 1501 07/20/14 2020 07/21/14 0627  BP: 183/39 124/31 136/34 151/54  Pulse: 58 45 50 56  Temp:   98.7 F (37.1 C) 97.4 F (36.3 C)  TempSrc:   Oral Oral  Resp:   22 20  Height:      Weight:    124 lb (56.246 kg)  SpO2:   96% 97%   Weight change: 4 lb 8.2 oz (2.046 kg)  Intake/Output Summary (Last 24 hours) at 07/21/14 2585 Last data filed at 07/21/14 0700  Gross per 24 hour  Intake    650 ml  Output    650 ml  Net      0 ml   General: resting in bed HEENT: PERRL, EOMI, no scleral icterus Cardiac: RRR, no rubs, murmurs or gallops Pulm: Mild rhonchi, improved over interval, moving normal volumes of air Abd: soft, nontender, nondistended, BS present Ext: warm and well perfused, no pedal edema Neuro: alert and oriented X3, cranial nerves II-XII grossly intact Skin: diffuse ecchymoses and thin skin Psych: appropriate affect  Lab Results: Basic Metabolic Panel:  Recent Labs Lab 07/19/14 1222  NA 146  K 5.1  CL 107  CO2 21  GLUCOSE 221*  BUN 32*  CREATININE 1.68*  CALCIUM 8.3*   Liver Function Tests: No results for input(s): AST, ALT, ALKPHOS, BILITOT, PROT, ALBUMIN in the last 168 hours. No results for input(s): LIPASE, AMYLASE in the last 168 hours. No results for input(s): AMMONIA in the last 168 hours. CBC:  Recent Labs Lab 07/19/14 1222 07/21/14 0741  WBC 12.5* 12.4*  NEUTROABS 11.8*  --   HGB 10.1* 10.3*  HCT 32.9* 33.7*  MCV 97.6 97.1  PLT 303 256   Cardiac Enzymes:  Recent Labs Lab 07/19/14 1222 07/20/14 1925 07/20/14 2316  TROPONINI <0.30 <0.30 0.19   BNP:  Recent Labs Lab 07/19/14 1222  PROBNP 50428.0*   D-Dimer: No results for input(s): DDIMER in the last 168 hours. CBG:  Recent  Labs Lab 07/19/14 2135 07/20/14 0634 07/20/14 1151 07/20/14 1706 07/20/14 2103 07/21/14 0657  GLUCAP 292* 164* 180* 152* 162* 194*   Hemoglobin A1C: No results for input(s): HGBA1C in the last 168 hours. Fasting Lipid Panel: No results for input(s): CHOL, HDL, LDLCALC, TRIG, CHOLHDL, LDLDIRECT in the last 168 hours. Thyroid Function Tests: No results for input(s): TSH, T4TOTAL, FREET4, T3FREE, THYROIDAB in the last 168 hours. Coagulation: No results for input(s): LABPROT, INR in the last 168 hours. Anemia Panel: No results for input(s): VITAMINB12, FOLATE, FERRITIN, TIBC, IRON, RETICCTPCT in the last 168 hours. Urine Drug Screen: Drugs of Abuse  No results found for: LABOPIA, COCAINSCRNUR, LABBENZ, AMPHETMU, THCU, LABBARB  Alcohol Level: No results for input(s): ETH in the last 168 hours. Urinalysis:  Recent Labs Lab 07/19/14 1151  COLORURINE YELLOW  LABSPEC 1.014  PHURINE 5.5  GLUCOSEU NEGATIVE  HGBUR TRACE*  BILIRUBINUR NEGATIVE  KETONESUR NEGATIVE  PROTEINUR >300*  UROBILINOGEN 0.2  NITRITE NEGATIVE  LEUKOCYTESUR NEGATIVE   Micro Results: Recent Results (from the past 240 hour(s))  Urine culture     Status: None   Collection Time: 07/19/14 11:51 AM  Result Value Ref Range Status   Specimen Description URINE, CATHETERIZED  Final   Special Requests NONE  Final   Culture  Setup Time   Final    07/19/2014 21:31 Performed at [place] Performed at [facility]   Final   Culture NO GROWTH Performed at [facility]   Final   Report Status 07/20/2014 FINAL  Final  Culture, blood (routine x 2)     Status: None (Preliminary result)   Collection Time: 07/19/14 12:25 PM  Result Value Ref Range Status   Specimen Description BLOOD RIGHT HAND  Final   Special Requests BOTTLES DRAWN AEROBIC AND ANAEROBIC 5 CC  Final   Culture  Setup Time   Final    07/19/2014 18:32 Performed at [facility]    Culture    Final           BLOOD CULTURE RECEIVED NO GROWTH TO DATE CULTURE WILL BE HELD FOR 5 DAYS BEFORE ISSUING A FINAL NEGATIVE REPORT Performed at [facility]    Report Status PENDING  Incomplete  Culture, blood (routine x 2)     Status: None (Preliminary result)   Collection Time: 07/19/14 12:25 PM  Result Value Ref Range Status   Specimen Description BLOOD LEFT HAND  Final   Special Requests BOTTLES DRAWN AEROBIC AND ANAEROBIC 5ML  Final   Culture  Setup Time   Final    07/19/2014 18:32 Performed at [facility]    Culture   Final           BLOOD CULTURE RECEIVED NO GROWTH TO DATE CULTURE WILL BE HELD FOR 5 DAYS BEFORE ISSUING A FINAL NEGATIVE REPORT Performed at [facility]    Report Status PENDING  Incomplete  MRSA PCR Screening     Status: None   Collection Time: 07/19/14  6:37 PM  Result Value Ref Range Status   MRSA by PCR NEGATIVE NEGATIVE Final    Comment:        The GeneXpert MRSA Assay (FDA approved for NASAL specimens only), is one component of a comprehensive MRSA colonization surveillance program. It is not intended to diagnose MRSA infection nor to guide or monitor treatment for MRSA infections.    Studies/Results: Dg Chest Port 1 View  07/19/2014   CLINICAL DATA:  Shortness of Breath  EXAM: PORTABLE CHEST - 1 VIEW  COMPARISON:  July 04, 2014  FINDINGS: There remains moderate interstitial edema with bibasilar consolidation. There are bilateral effusions with cardiomegaly. The pulmonary vascularity appears within normal limits. No adenopathy. There is evidence of old trauma involving the proximal left humeral metaphysis. There is a total shoulder replacement on the right.  IMPRESSION: Persistent congestive heart failure. Question superimposed pneumonia in both lower lobes. Both pneumonia and congestive heart failure may exist concurrently.   Electronically Signed   By: Lowella Grip M.D.   On: 07/19/2014 12:26   Medications: I have  reviewed the patient's current medications. Scheduled Meds: . antiseptic oral rinse  7 mL Mouth Rinse BID  . ceFEPime (MAXIPIME) IV  500 mg Intravenous Q24H  . donepezil  5 mg Oral QHS  . feeding supplement (ENSURE COMPLETE)  237 mL Oral BID BM  . insulin aspart  0-9 Units Subcutaneous TID WC  . propranolol  40 mg Oral BID  . simvastatin  10 mg Oral q1800  . vancomycin  750 mg Intravenous Q48H   Continuous Infusions:  PRN Meds:.acetaminophen, albuterol, polyvinyl alcohol Assessment/Plan: Principal Problem:   HCAP (healthcare-associated pneumonia) Active Problems:   AKI (acute kidney injury)  Patient is a  80 year old female with a history of dementia, coronary artery disease status post stenting, hyperlipidemia, type 2 diabetes, decubitus ulcers, recent shoulder trauma who presents with healthcare associated pneumonia.   HCAP: Patient has defervesced with no fevers since initial presentation. Unable to track white counts since patient has been difficult to phlebotomize. Lung exam improved over the interval. Patient alert and oriented 4. -Continue vancomycin (start date 07/19/2014) -Continue cefipime (cephalexin allergy listed, though reaction is atypical, start date 07/19/2014) -blood cultures x 2 NGTD - UA negative, urine culture NGTD - PT/OT consulted  CAD s/p stent: Presenting EKG concerning for [employer] T waves. Troponin trending down from 0.19 to 0.15 overnight. Patient denies any chest pain. Given age and goals of care, further cardiology workup and therapy would likely be low yield. - decreased home propanolol from 60mg  BID to 40mg  2/2 to bradycardia ( HR around 55's)  Type 2 diabetes: hemoglobin A1c's 7.3 on 06/30/14. CBGs in the high 100s to low 200s.  - Sliding-scale insulin (sensitive) - Hold home Januvia and glipizide  Anion gap acidosis: Found on admission but no serial labs as above. Bicarbonate 21, anion gap 18. No ketones in the urine. Likely 2/2 to starvation  ketosis. Pt has had decrease po intake.  -Continue to monitor if possible  Dementia:  - continue home Aricept 5mg  qhs  Hyperlipidemia: - continue home Zocor 10 mg daily at bedtime   Decubitus ulcers - wound care consulted  Diet: Dysphagia 1 Prophylaxis: SCDs  Code: DNR/DNI, pt has paperwork with her.   Dispo: Disposition is deferred at this time, awaiting improvement of current medical problems. Anticipated discharge in approximately 2-3 day(s).   The patient does have a current PCP (Rafaela M. Ruben Gottron, MD) and does not need an [facility] hospital follow-up appointment after discharge.  The patient does not have transportation limitations that hinder transportation to clinic appointments.  .Services Needed at time of discharge: Y = Yes, Blank = No PT:   OT:   RN:   Equipment:   Other:     LOS: 2 days   Luan Moore, MD PhD  07/21/2014, 8:28 AM

## 2014-07-21 NOTE — Progress Notes (Signed)
2 unsuccessful attempts made to in/out cath pt. Resistance was met and pt unable to tolerate. MD notified. MD told report off to next shift and allow pt to rest. Will continue to monitor patient for further changes in condition.

## 2014-07-21 NOTE — Progress Notes (Signed)
Dear Doctor: Dareen Piano This patient has been identified as a candidate for PICC for the following reason (s): poor veins/poor circulatory system (CHF, COPD, emphysema, diabetes, steroid use, IV drug abuse, etc.) If you agree, please write an order for the indicated device. For any questions contact the Vascular Access Team at [phone] if no answer, please leave a message.  Thank you for supporting the early vascular access assessment program.

## 2014-07-21 NOTE — Progress Notes (Addendum)
ANTIBIOTIC CONSULT NOTE - FOLLOW UP  Pharmacy Consult for Levaquin Indication: HCAP  Allergies  Allergen Reactions  . Cephalexin Other (See Comments)    Confusion/Altered mental status per family     Patient Measurements: Height: 4\' 9"  (144.8 cm) Weight: 124 lb (56.246 kg) (bedscale) IBW/kg (Calculated) : 38.6  Vital Signs: Temp: 98.5 F (36.9 C) (12/22 1503) Temp Source: Axillary (12/22 1503) BP: 126/48 mmHg (12/22 1503) Pulse Rate: 41 (12/22 1503) Intake/Output from previous day: 12/21 0701 - 12/22 0700 In: 1239.6 [P.O.:600; I.V.:589.6; IV Piggyback:50] Out: 2175 [Urine:2175] Intake/Output from this shift: Total I/O In: 120 [P.O.:120] Out: 0   Labs:  Recent Labs  07/19/14 1222 07/21/14 0741  WBC 12.5* 12.4*  HGB 10.1* 10.3*  PLT 303 256  CREATININE 1.68* 1.67*   Estimated Creatinine Clearance: 13.2 mL/min (by C-G formula based on Cr of 1.67). No results for input(s): VANCOTROUGH, VANCOPEAK, VANCORANDOM, GENTTROUGH, GENTPEAK, GENTRANDOM, TOBRATROUGH, TOBRAPEAK, TOBRARND, AMIKACINPEAK, AMIKACINTROU, AMIKACIN in the last 72 hours.   Microbiology: Recent Results (from the past 720 hour(s))  Urine culture     Status: None   Collection Time: 06/30/14  2:30 AM  Result Value Ref Range Status   Specimen Description URINE, RANDOM  Final   Special Requests NONE  Final   Culture  Setup Time   Final    06/30/2014 08:43 Performed at [place]   Final    75,000 COLONIES/ML Performed at [facility]    Culture   Final    Multiple bacterial morphotypes present, none predominant. Suggest appropriate recollection if clinically indicated. Performed at [facility]    Report Status 07/01/2014 FINAL  Final  MRSA PCR Screening     Status: None   Collection Time: 06/30/14  7:16 PM  Result Value Ref Range Status   MRSA by PCR NEGATIVE NEGATIVE Final    Comment:        The GeneXpert MRSA Assay (FDA approved for NASAL  specimens only), is one component of a comprehensive MRSA colonization surveillance program. It is not intended to diagnose MRSA infection nor to guide or monitor treatment for MRSA infections.   Culture, blood (routine x 2)     Status: None   Collection Time: 06/30/14 11:21 PM  Result Value Ref Range Status   Specimen Description BLOOD LEFT HAND  Final   Special Requests BOTTLES DRAWN AEROBIC ONLY [facility]  Final   Culture  Setup Time   Final    07/01/2014 05:25 Performed at [place]   Final    NO GROWTH 5 DAYS Performed at [facility]    Report Status 07/07/2014 FINAL  Final  Culture, blood (routine x 2)     Status: None   Collection Time: 06/30/14 11:37 PM  Result Value Ref Range Status   Specimen Description BLOOD RIGHT HAND  Final   Special Requests BOTTLES DRAWN AEROBIC ONLY 2CC  Final   Culture  Setup Time   Final    07/01/2014 05:25 Performed at [place]   Final    NO GROWTH 5 DAYS Performed at [facility]    Report Status 07/07/2014 FINAL  Final  Urine culture     Status: None   Collection Time: 07/19/14 11:51 AM  Result Value Ref Range Status   Specimen Description URINE, CATHETERIZED  Final   Special Requests NONE  Final   Culture  Setup Time  Final    07/19/2014 21:31 Performed at [place] Performed at [facility]   Final   Culture NO GROWTH Performed at [facility]   Final   Report Status 07/20/2014 FINAL  Final  Culture, blood (routine x 2)     Status: None (Preliminary result)   Collection Time: 07/19/14 12:25 PM  Result Value Ref Range Status   Specimen Description BLOOD RIGHT HAND  Final   Special Requests BOTTLES DRAWN AEROBIC AND ANAEROBIC 5 CC  Final   Culture  Setup Time   Final    07/19/2014 18:32 Performed at [facility]    Culture   Final           BLOOD CULTURE RECEIVED NO GROWTH TO DATE CULTURE WILL BE  HELD FOR 5 DAYS BEFORE ISSUING A FINAL NEGATIVE REPORT Performed at [facility]    Report Status PENDING  Incomplete  Culture, blood (routine x 2)     Status: None (Preliminary result)   Collection Time: 07/19/14 12:25 PM  Result Value Ref Range Status   Specimen Description BLOOD LEFT HAND  Final   Special Requests BOTTLES DRAWN AEROBIC AND ANAEROBIC 5ML  Final   Culture  Setup Time   Final    07/19/2014 18:32 Performed at [facility]    Culture   Final           BLOOD CULTURE RECEIVED NO GROWTH TO DATE CULTURE WILL BE HELD FOR 5 DAYS BEFORE ISSUING A FINAL NEGATIVE REPORT Performed at [facility]    Report Status PENDING  Incomplete  MRSA PCR Screening     Status: None   Collection Time: 07/19/14  6:37 PM  Result Value Ref Range Status   MRSA by PCR NEGATIVE NEGATIVE Final    Comment:        The GeneXpert MRSA Assay (FDA approved for NASAL specimens only), is one component of a comprehensive MRSA colonization surveillance program. It is not intended to diagnose MRSA infection nor to guide or monitor treatment for MRSA infections.     Anti-infectives    Start     Dose/Rate Route Frequency Ordered Stop   07/23/14 1800  levofloxacin (LEVAQUIN) tablet 500 mg     500 mg Oral Every 48 hours 07/21/14 1634     07/21/14 2200  doxycycline (VIBRA-TABS) tablet 100 mg     100 mg Oral Every 12 hours 07/21/14 1610     07/21/14 1800  levofloxacin (LEVAQUIN) IVPB 750 mg     750 mg100 mL/hr over 90 Minutes Intravenous  Once 07/21/14 1634     07/21/14 1500  vancomycin (VANCOCIN) IVPB 750 mg/150 ml premix  Status:  Discontinued     750 mg150 mL/hr over 60 Minutes Intravenous Every 48 hours 07/19/14 1528 07/21/14 1610   07/20/14 1400  ceFEPIme (MAXIPIME) 500 mg in dextrose 5 % 50 mL IVPB  Status:  Discontinued     500 mg100 mL/hr over 30 Minutes Intravenous Every 24 hours 07/20/14 1242 07/21/14 1610   07/19/14 2200  aztreonam (AZACTAM) 2 g in dextrose 5 % 50 mL  IVPB  Status:  Discontinued     2 g100 mL/hr over 30 Minutes Intravenous 3 times per day 07/19/14 1822 07/19/14 1826   07/19/14 2200  aztreonam (AZACTAM) 500 mg in dextrose 5 % 50 mL IVPB  Status:  Discontinued     500 mg100 mL/hr over 30 Minutes Intravenous 3  times per day 07/19/14 1528 07/20/14 1208   07/19/14 1500  vancomycin (VANCOCIN) IVPB 1000 mg/200 mL premix     1,000 mg200 mL/hr over 60 Minutes Intravenous  Once 07/19/14 1448 07/19/14 1755   07/19/14 1445  aztreonam (AZACTAM) 1 g in dextrose 5 % 50 mL IVPB     1 g100 mL/hr over 30 Minutes Intravenous  Once 07/19/14 1443 07/19/14 1654      Assessment: 80 y/o female who has been on vancomycin and cefepime for HCAP to transition to Levaquin. She is afebrile but WBC remain elevated. Renal function is stable.  Goal of Therapy:  Eradication of infection  Plan:  - Levaquin 750 mg IV once then 500 mg PO q48h - Monitor renal function and clinical course - Recommend stop date of 12/27 to complete 8 days  [facility], [place].D., BCPS Clinical Pharmacist Pager: [phone] 07/21/2014 4:38 PM   Addendum: Asked to change to PO Levaquin - poor IV access.  Levaquin 750 mg PO once then 500 mg PO q48h  [facility], Pharm.D., BCPS Clinical Pharmacist Pager: [phone] 07/21/2014 6:06 PM

## 2014-07-21 NOTE — Clinical Documentation Improvement (Signed)
Supporting Information: Acute/Chronic CHF, unspecified noted per 12/20 progress notes.  CXR: 12/20: Persistent congestive heart failure. Question superimposed pneumonia in both lower lobes. Both pneumonia and congestive heart failure may exist concurrently.   Possible Clinical Condition: . Document acuity --Acute --Chronic --Acute on Chronic . Document type --Diastolic --Systolic --Combined systolic and diastolic . Due to or associated with --Cardiac or other surgery --Hypertension --Valvular disease --Rheumatic heart disease Endocarditis (valvitis) Pericarditis Myocarditis --Other (specify)    Thank Sherian Maroon Documentation Specialist [phone] [email or web address]

## 2014-07-21 NOTE — Consult Note (Signed)
Referral received for [facility] Care Management services but unfortunately the patient is not eligible for services as her primary care provider is not in network. Met with patient and family at bedside to confirm who her primary care provider is.  For any further questions, please call Natividad Brood, RN, BSN, CCM at [phone].

## 2014-07-22 DIAGNOSIS — R339 Retention of urine, unspecified: Secondary | ICD-10-CM

## 2014-07-22 LAB — URINALYSIS, ROUTINE W REFLEX MICROSCOPIC
Bilirubin Urine: NEGATIVE
GLUCOSE, UA: NEGATIVE mg/dL
Ketones, ur: NEGATIVE mg/dL
LEUKOCYTES UA: NEGATIVE
NITRITE: NEGATIVE
Specific Gravity, Urine: 1.017 (ref 1.005–1.030)
Urobilinogen, UA: 0.2 mg/dL (ref 0.0–1.0)
pH: 5.5 (ref 5.0–8.0)

## 2014-07-22 LAB — GLUCOSE, CAPILLARY
GLUCOSE-CAPILLARY: 126 mg/dL — AB (ref 70–99)
Glucose-Capillary: 184 mg/dL — ABNORMAL HIGH (ref 70–99)
Glucose-Capillary: 183 mg/dL — ABNORMAL HIGH (ref 70–99)

## 2014-07-22 LAB — URINE MICROSCOPIC-ADD ON

## 2014-07-22 MED ORDER — DOXYCYCLINE HYCLATE 100 MG PO TABS: 100.0000 mg | ORAL_TABLET | Freq: Two times a day (BID) | ORAL | Status: AC

## 2014-07-22 MED ORDER — LEVOFLOXACIN 500 MG PO TABS: 500.0000 mg | ORAL_TABLET | ORAL | Status: AC

## 2014-07-22 MED ORDER — PROPRANOLOL HCL 40 MG PO TABS: 40.0000 mg | ORAL_TABLET | Freq: Two times a day (BID) | ORAL | Status: DC

## 2014-07-22 NOTE — Progress Notes (Signed)
Jarrett Soho, RN requested assistance in insertion of intermittent catheter due to multiple failed insertions and pt discomfort during insertion.  Catheter place on first attempt with return of 400 cc amber urine.  Pt has significant swelling to her perineum .   Tolerated uncomfortable procedure with mod distress.  Will continue to monitor for urinary retention and readdress possibility of foley cather for patient comfort.

## 2014-07-22 NOTE — Progress Notes (Signed)
Patient has 315mL in her bladder this AM per bladder scan.     She gets in distress (increased breathing issues, panicky) while intermittent cath'ing.   I will not do an In and out this AM on my shift.  Will let patient relax. Paged oncall MD.

## 2014-07-22 NOTE — Discharge Summary (Signed)
Name: Molly Ashley MRN: [id number] DOB: [birth year] 80 y.o. PCP: Wynonia Musty. Ruben Gottron, MD  Date of Admission: 07/19/2014 11:03 AM Date of Discharge: 07/22/2014 Attending Physician: Aldine Contes, MD  Discharge Diagnosis: Principal Problem:   HCAP (healthcare-associated pneumonia) Active Problems:   AKI (acute kidney injury)  Discharge Medications:   Medication List    TAKE these medications        acetaminophen 500 MG tablet  Commonly known as:  TYLENOL  Take 500 mg by mouth every 6 (six) hours as needed (for pain.).     donepezil 5 MG tablet  Commonly known as:  ARICEPT  Take 5 mg by mouth at bedtime.     doxycycline 100 MG tablet  Commonly known as:  VIBRA-TABS  Take 1 tablet (100 mg total) by mouth every 12 (twelve) hours.     glipiZIDE 5 MG tablet  Commonly known as:  GLUCOTROL  Take 5 mg by mouth daily.     levofloxacin 500 MG tablet  Commonly known as:  LEVAQUIN  Take 1 tablet (500 mg total) by mouth every other day.  Start taking on:  07/23/2014     propranolol 40 MG tablet  Commonly known as:  INDERAL  Take 1 tablet (40 mg total) by mouth 2 (two) times daily.     simvastatin 10 MG tablet  Commonly known as:  ZOCOR  Take 10 mg by mouth daily.     sitaGLIPtin 25 MG tablet  Commonly known as:  JANUVIA  Take 25 mg by mouth every other day.     SYSTANE OP  Apply 1 drop to eye as needed (dry eyes).        Disposition and follow-up:   Molly Ashley was discharged from [facility] in Stable condition.  At the hospital follow up visit please address:  1.  Resolution of pneumonia.  Completion of 10 day course of antibiotics (end date of 07/28/2014).  Patient discharged with a Foley catheter. Trial to void on 07/24/2014 to 07/25/2014. Consider urology referral if it is unsuccessful.  2.  Labs / imaging needed at time of follow-up: none  3.  Pending labs/ test needing follow-up: none  Follow-up Appointments: Follow-up  Information    Follow up with Robyne Peers., MD.   Specialty:  Family Medicine   Why:  January 6 at 3 pm   Contact information:   [address] [place] [address] [phone]       Discharge Instructions: Discharge Instructions    Call MD for:  difficulty breathing, headache or visual disturbances    Complete by:  As directed      Call MD for:  extreme fatigue    Complete by:  As directed      Call MD for:  hives    Complete by:  As directed      Call MD for:  persistant dizziness or light-headedness    Complete by:  As directed      Call MD for:  persistant nausea and vomiting    Complete by:  As directed      Call MD for:  redness, tenderness, or signs of infection (pain, swelling, redness, odor or green/yellow discharge around incision site)    Complete by:  As directed      Call MD for:  severe uncontrolled pain    Complete by:  As directed      Call MD for:  temperature >100.4  Complete by:  As directed      Diet - low sodium heart healthy    Complete by:  As directed      Diet - low sodium heart healthy    Complete by:  As directed      Increase activity slowly    Complete by:  As directed      Increase activity slowly    Complete by:  As directed            Please follow up with Dr. Ruben Gottron on January 6 at 3 pm. Please continue taking your antibiotics until 07/28/14. Please reduce your home dose of propranolol to 40 mg from 60 mg. If you are not able to urinate without a foley catheter in 2-3 days (December 25 26), please seek further evaluation by a urologist.  Consultations:    Procedures Performed:  Dg Elbow Complete Left  06/27/2014   CLINICAL DATA:  Left elbow pain, swelling and bruising after tripping on a blanket and falling while walking to her bed on 06/24/2014  EXAM: LEFT ELBOW - COMPLETE 3+ VIEW  COMPARISON:  None.  FINDINGS: Diffuse soft tissue swelling. No fracture, dislocation or effusion. Distal triceps tendon calcification.   IMPRESSION: 1. No fracture. 2. Distal triceps calcific tendinitis.   Electronically Signed   By: Enrique Sack M.D.   On: 06/27/2014 18:24   Dg Forearm Left  06/30/2014   CLINICAL DATA:  Arm bruising. Fluid collection on the ulnar aspect of the forearm. Subsequent encounter.  EXAM: LEFT FOREARM - 2 VIEW  COMPARISON:  06/27/2014.  FINDINGS: The radius and ulna appear normal. There is diffuse subcutaneous edema extending from above the elbow through the forearm. Old ulnar styloid avulsion. Osteoarthritis of the wrist.  IMPRESSION: Soft tissue edema.  No osseous injury.   Electronically Signed   By: Dereck Ligas M.D.   On: 06/30/2014 14:59   Dg Wrist Complete Left  06/27/2014   CLINICAL DATA:  Left wrist pain, swelling and bruising after tripping on a blanket and falling while walking to her bed on 06/24/2014.  EXAM: LEFT WRIST - COMPLETE 3+ VIEW  COMPARISON:  None.  FINDINGS: Diffuse osteopenia. Oval corticated bone fragment distal to the ulnar styloid. No acute fracture or dislocation.  IMPRESSION: No acute fracture.   Electronically Signed   By: Enrique Sack M.D.   On: 06/27/2014 18:22   Ct Head Wo Contrast  06/30/2014   CLINICAL DATA:  80 year old female with altered mental status. Initial encounter.  EXAM: CT HEAD WITHOUT CONTRAST  TECHNIQUE: Contiguous axial images were obtained from the base of the skull through the vertex without intravenous contrast.  COMPARISON:  06/24/2014 and prior head CTs  FINDINGS: Atrophy and chronic small-vessel white matter ischemic changes are again noted.  No acute intracranial abnormalities are identified, including mass lesion or mass effect, hydrocephalus, extra-axial fluid collection, midline shift, hemorrhage, or acute infarction.  The visualized bony calvarium is unremarkable.  Left frontal scalp soft tissue swelling has decreased.  IMPRESSION: No evidence of acute intracranial abnormality.  Decreased left frontal scalp soft tissue swelling.  Atrophy and chronic  small-vessel white matter ischemic changes.   Electronically Signed   By: Hassan Rowan M.D.   On: 06/30/2014 23:04   Ct Head Wo Contrast  06/24/2014   CLINICAL DATA:  Unwitnessed fall.  EXAM: CT HEAD WITHOUT CONTRAST  CT CERVICAL SPINE WITHOUT CONTRAST  TECHNIQUE: Multidetector CT imaging of the head and cervical spine was performed following the standard  protocol without intravenous contrast. Multiplanar CT image reconstructions of the cervical spine were also generated.  COMPARISON:  None.  FINDINGS: CT HEAD FINDINGS  Prominence of the sulci and ventricles are identified compatible with brain atrophy. There is mild low attenuation within the subcortical and periventricular white matter. No acute cortical infarct, hemorrhage, or mass lesion ispresent. No significant extra-axial fluid collection is present. The paranasal sinuses andmastoid air cells are clear. The osseous skull is intact. Left frontal scalp hematoma is identified which measures 2.1 x 0.9 cm. There is no underlying skull fracture.  CT CERVICAL SPINE FINDINGS  Normal alignment of the cervical spine. There is multi level disc space narrowing and ventral endplate spurring identified. The prevertebral soft tissue space appears normal. The facet joints are aligned.  IMPRESSION: 1. No acute intracranial abnormalities. 2. Small vessel ischemic disease and brain atrophy. 3. No evidence for cervical spine fracture. 4. Cervical spondylosis.   Electronically Signed   By: Kerby Moors M.D.   On: 06/24/2014 02:44   Ct Cervical Spine Wo Contrast  06/24/2014   CLINICAL DATA:  Unwitnessed fall.  EXAM: CT HEAD WITHOUT CONTRAST  CT CERVICAL SPINE WITHOUT CONTRAST  TECHNIQUE: Multidetector CT imaging of the head and cervical spine was performed following the standard protocol without intravenous contrast. Multiplanar CT image reconstructions of the cervical spine were also generated.  COMPARISON:  None.  FINDINGS: CT HEAD FINDINGS  Prominence of the sulci and  ventricles are identified compatible with brain atrophy. There is mild low attenuation within the subcortical and periventricular white matter. No acute cortical infarct, hemorrhage, or mass lesion ispresent. No significant extra-axial fluid collection is present. The paranasal sinuses andmastoid air cells are clear. The osseous skull is intact. Left frontal scalp hematoma is identified which measures 2.1 x 0.9 cm. There is no underlying skull fracture.  CT CERVICAL SPINE FINDINGS  Normal alignment of the cervical spine. There is multi level disc space narrowing and ventral endplate spurring identified. The prevertebral soft tissue space appears normal. The facet joints are aligned.  IMPRESSION: 1. No acute intracranial abnormalities. 2. Small vessel ischemic disease and brain atrophy. 3. No evidence for cervical spine fracture. 4. Cervical spondylosis.   Electronically Signed   By: Kerby Moors M.D.   On: 06/24/2014 02:44   Dg Chest Port 1 View  07/19/2014   CLINICAL DATA:  Shortness of Breath  EXAM: PORTABLE CHEST - 1 VIEW  COMPARISON:  July 04, 2014  FINDINGS: There remains moderate interstitial edema with bibasilar consolidation. There are bilateral effusions with cardiomegaly. The pulmonary vascularity appears within normal limits. No adenopathy. There is evidence of old trauma involving the proximal left humeral metaphysis. There is a total shoulder replacement on the right.  IMPRESSION: Persistent congestive heart failure. Question superimposed pneumonia in both lower lobes. Both pneumonia and congestive heart failure may exist concurrently.   Electronically Signed   By: Lowella Grip M.D.   On: 07/19/2014 12:26   Dg Chest Port 1 View  07/04/2014   CLINICAL DATA:  Shortness of breath today, now resolved. Initial encounter.  EXAM: PORTABLE CHEST - 1 VIEW  COMPARISON:  06/30/2014 and 07/03/2014.  FINDINGS: 0947 hr. The patient is rotated to the right. There are persistent low lung volumes with  bilateral pleural effusions, probable bibasilar atelectasis and mild edema. No new airspace disease or pneumothorax demonstrated. Right shoulder arthroplasty and mildly displaced subacute fracture of the left humeral neck noted.  IMPRESSION: No significant change in pulmonary edema, pleural effusions and  bibasilar atelectasis. Subacute appearing left humeral neck fracture.   Electronically Signed   By: Camie Patience M.D.   On: 07/04/2014 11:45   Dg Chest Port 1 View  07/03/2014   CLINICAL DATA:  Dyspnea  EXAM: PORTABLE CHEST - 1 VIEW  COMPARISON:  06/30/2014  FINDINGS: The patient's head overlies the apices. The patient is rotated to the right. Moderate pleural effusions have increased since the previous exam. Central vascular congestion is noted with a few interstitial Kerley B-lines which may indicate developing edema. Moderate enlargement of the cardiac silhouette is noted. Right humeral prosthesis partly visualized. Chronic left humeral deformity reidentified.  IMPRESSION: Increased bilateral pleural effusions with probable developing interstitial pulmonary edema.   Electronically Signed   By: Conchita Paris M.D.   On: 07/03/2014 17:12   Dg Chest Port 1 View  06/30/2014   CLINICAL DATA:  Fracture of the the LEFT humerus.  EXAM: PORTABLE CHEST - 1 VIEW  COMPARISON:  06/30/2014.  05/04/2013.  FINDINGS: Comminuted proximal LEFT humerus fracture is noted along with calcific tendinitis.  Cardiomegaly. Tortuous thoracic aorta. Aortic arch atherosclerosis. RIGHT shoulder hemiarthroplasty noted. There is no acute cardiopulmonary disease. Age related interstitial opacity, most prominent at the lung bases. No airspace disease. No effusion. No displaced rib fractures or pneumothorax.  IMPRESSION: 1. Cardiomegaly without failure. 2. No acute cardiopulmonary disease.   Electronically Signed   By: Dereck Ligas M.D.   On: 06/30/2014 19:38   Dg Shoulder Left  06/24/2014   CLINICAL DATA:  Fall with left shoulder  bruising.  EXAM: LEFT SHOULDER - 2+ VIEW  COMPARISON:  None.  FINDINGS: Located glenohumeral and acromioclavicular joints. No convincing fracture. There is chronic insertional changes to the greater tuberosity. Spurring is noted around the glenoid. Osteopenia.  IMPRESSION: No acute osseous findings.   Electronically Signed   By: Jorje Guild M.D.   On: 06/24/2014 03:06   Dg Knee Complete 4 Views Left  06/24/2014   CLINICAL DATA:  Fall with left knee bruising.  Initial encounter  EXAM: LEFT KNEE - COMPLETE 4+ VIEW  COMPARISON:  None.  FINDINGS: There is no evidence of fracture, dislocation, or joint effusion. There is mild diffuse joint narrowing without significant spurring.  Osteopenia.  SFA atherosclerosis.  IMPRESSION: No acute osseous findings.   Electronically Signed   By: Jorje Guild M.D.   On: 06/24/2014 03:08   Dg Humerus Left  06/30/2014   CLINICAL DATA:  Bruising about the left shoulder and upper arm. Pain. The patient is demented and unable to provide further history. Initial encounter.  EXAM: LEFT HUMERUS - 2+ VIEW  COMPARISON:  None.  FINDINGS: There is surgical neck fracture of the left humerus with 1 shaft with anterior displacement and impaction. The humeral head is located. There is acromioclavicular degenerative change and calcific rotator cuff tendinopathy.  IMPRESSION: Acute and impacted and anteriorly displaced surgical neck fracture left humerus.   Electronically Signed   By: Inge Rise M.D.   On: 06/30/2014 14:58   Admission HPI:   Patient is a 80 year old female with a history of dementia, type 2 diabetes, coronary artery disease who presents to the emergency department for SOB. Pt was in her usual state last night and the facility staff at [facility] this morning noted that she was having difficulty swallowing food and having shortness of breath. She was on her home 2L O2 [place] and satting at 81% which remained unchanged with increasing O2 to 3L. EMS was then called.  Per report pt was placed on CPAP which increased O2 sat to 92%. She was then eventually transitioned to a non re breather in the ED w/ O2 sat at 99%. Pt denies any pain other than from her sacral wounds and left humerus fracture. She denies difficulty breathing, dysuria, chest pain, leg swelling, fevers, sick contacts, and diarrhea. Pt's granddaughter is in the room and states pt is at her baseline regarding mental status. Pt believes she may have had one stent placed over 20 years ago which is when she was started on propanolol. Otherwise denies hx of heart failure. Pt has been on a puree diet at her nursing facility for the past 2-3 days secondary to dysphagia. This morning granddaughter states that the patient was having difficulty clearing her secretions.   Pt was recently admitted to [facility] on 12/1-4 fro acute encephalopathy secondary to a UTI. She was discharged home with cephalexin x 8 days (end date 07/07/14). She states that she completed her course of abx.   Hospital Course by problem list: Principal Problem:   HCAP (healthcare-associated pneumonia) Active Problems:   AKI (acute kidney injury)  HCAP: Patient was originally admitted from [facility] facility with shortness of breath and increased oxygen requirement above her baseline of 2 L of oxygen nasal cannula. Patient was found to be febrile with a leukocytosis. Chest x-ray consistent with bilateral pneumonia and/or pulmonary edema. Patient was started on antibiotics on 07/19/2014, initially vancomycin and aztreonam, and transitioned to vancomycin and cefepime (initially aztreonam because of concern for cephalexin allergy, though upon further review, the reaction was quite atypical: AMS). Patient defervesced and remained afebrile throughout the rest of her admission. On 07/21/2014, patient was transitioned to oral levofloxacin and doxycycline. Blood cultures and urine cultures on the day of discharge were no growth to date. Patient's  breathing is much improved along with her lung exam. Patient back on her home level of oxygen of 2L by discharge.  Urinary retention: Patient with persistent urinary retention throughout this admission and managed with clean intermittent catheterization. Patient not on any medications that are significantly associated with urinary retention. On 07/22/2014, the day of discharge, patient continued to have issues with urinary retention and a Foley catheter was placed. Patient needs to have a trial to void within 48-72 hours at her skilled nursing facility and if she fails that trial, patient will need further workup by urologist.  CAD s/p stent: Patient initially presenting with EKG concerning for [employer] T waves. Troponins were trended initially elevated at 0.19 but trended down to 0.15. Patient denied any chest pain throughout her admission. Given goals of care, further cardiology workup and therapy would have been likely low yield. Patient was continued on her home propanolol but at a lower dose of 40 mg twice a day secondary to bradycardia with heart rate around 55.  Type 2 diabetes: Patient with a hemoglobin A1c of 7.3 on 06/30/2014. Patient's blood glucoses remained elevated in the high 100s to low 200s and managed with a sensitive sliding scale insulin scale. Patient's home Januvia and glipizide were held.  Anion gap acidosis: Patient found on admission to have a bicarbonate of 21 and anion gap of 18. Patient with no ketones in urine. Her acidosis was likely secondary to starvation ketosis since she had decreased by mouth intake before her admission. Serial labs were not able to be obtained because patient remained to phlebotomize.   Dementia: Patient was continued on home Aricept 5 mg daily at bedtime.  Hyperlipidemia: She was continued on home Zocor 10 mg daily at bedtime.  Decubitus ulcers: Wounds throughout her back and arms were managed by wound care.  Discharge Vitals:   BP 147/62 mmHg   Pulse 94  Temp(Src) 97.6 F (36.4 C) (Oral)  Resp 22  Ht 4\' 9"  (1.448 m)  Wt 124 lb (56.246 kg)  BMI 26.83 kg/m2  SpO2 97%  Discharge Labs:  Results for orders placed or performed during the hospital encounter of 07/19/14 (from the past 24 hour(s))  Glucose, capillary     Status: Abnormal   Collection Time: 07/21/14  4:00 PM  Result Value Ref Range   Glucose-Capillary 187 (H) 70 - 99 mg/dL   Comment 1 Notify RN   Glucose, capillary     Status: Abnormal   Collection Time: 07/21/14  9:02 PM  Result Value Ref Range   Glucose-Capillary 206 (H) 70 - 99 mg/dL   Comment 1 Notify RN    Comment 2 Documented in Chart   Glucose, capillary     Status: Abnormal   Collection Time: 07/22/14  6:14 AM  Result Value Ref Range   Glucose-Capillary 184 (H) 70 - 99 mg/dL  Glucose, capillary     Status: Abnormal   Collection Time: 07/22/14 11:26 AM  Result Value Ref Range   Glucose-Capillary 183 (H) 70 - 99 mg/dL   Comment 1 Notify RN     Signed: Luan Moore, MD 07/22/2014, 1:16 PM    Services Ordered on Discharge: none Equipment Ordered on Discharge: none

## 2014-07-22 NOTE — Progress Notes (Signed)
Subjective:   Patient continues to report that she is doing well this morning without any complaints of chest pain or shortness of breath. Nursing reporting that patient has had issues with urinary retention since her admission.  Objective: Vital signs in last 24 hours: Filed Vitals:   07/21/14 1051 07/21/14 1503 07/21/14 2040 07/22/14 0629  BP: 155/65 126/48 147/55 147/62  Pulse: 60 41 79 94  Temp:  98.5 F (36.9 C) 97.5 F (36.4 C) 97.6 F (36.4 C)  TempSrc:  Axillary Oral Oral  Resp:  22 24 22   Height:      Weight:    124 lb (56.246 kg)  SpO2:  96% 96% 97%   Weight change: 0 lb (0 kg)  Intake/Output Summary (Last 24 hours) at 07/22/14 0848 Last data filed at 07/22/14 0835  Gross per 24 hour  Intake    180 ml  Output    400 ml  Net   -220 ml   General: resting in bed HEENT: PERRL, EOMI, no scleral icterus Cardiac: RRR, no rubs, murmurs or gallops Pulm: Improved rhonchi with bibasilar Rales, moving normal volumes of air Abd: soft, nontender, nondistended, BS present Ext: warm and well perfused, no pedal edema, trace pitting edema in bilateral arms Neuro: alert and oriented X3, cranial nerves II-XII grossly intact Skin: diffuse ecchymoses and thin skin Psych: appropriate affect  Lab Results: Basic Metabolic Panel:  Recent Labs Lab 07/19/14 1222 07/21/14 0741  NA 146 145  K 5.1 4.0  CL 107 112  CO2 21 24  GLUCOSE 221* 226*  BUN 32* 30*  CREATININE 1.68* 1.67*  CALCIUM 8.3* 8.1*   Liver Function Tests: No results for input(s): AST, ALT, ALKPHOS, BILITOT, PROT, ALBUMIN in the last 168 hours. No results for input(s): LIPASE, AMYLASE in the last 168 hours. No results for input(s): AMMONIA in the last 168 hours. CBC:  Recent Labs Lab 07/19/14 1222 07/21/14 0741  WBC 12.5* 12.4*  NEUTROABS 11.8*  --   HGB 10.1* 10.3*  HCT 32.9* 33.7*  MCV 97.6 97.1  PLT 303 256   Cardiac Enzymes:  Recent Labs Lab 07/20/14 1925 07/20/14 2316 07/21/14 0741    TROPONINI <0.30 0.19 0.15*   BNP:  Recent Labs Lab 07/19/14 1222  PROBNP 50428.0*   D-Dimer: No results for input(s): DDIMER in the last 168 hours. CBG:  Recent Labs Lab 07/20/14 2103 07/21/14 0657 07/21/14 1059 07/21/14 1600 07/21/14 2102 07/22/14 0614  GLUCAP 162* 194* 201* 187* 206* 184*   Hemoglobin A1C: No results for input(s): HGBA1C in the last 168 hours. Fasting Lipid Panel: No results for input(s): CHOL, HDL, LDLCALC, TRIG, CHOLHDL, LDLDIRECT in the last 168 hours. Thyroid Function Tests: No results for input(s): TSH, T4TOTAL, FREET4, T3FREE, THYROIDAB in the last 168 hours. Coagulation: No results for input(s): LABPROT, INR in the last 168 hours. Anemia Panel: No results for input(s): VITAMINB12, FOLATE, FERRITIN, TIBC, IRON, RETICCTPCT in the last 168 hours. Urine Drug Screen: Drugs of Abuse  No results found for: LABOPIA, COCAINSCRNUR, LABBENZ, AMPHETMU, THCU, LABBARB  Alcohol Level: No results for input(s): ETH in the last 168 hours. Urinalysis:  Recent Labs Lab 07/19/14 1151  COLORURINE YELLOW  LABSPEC 1.014  PHURINE 5.5  GLUCOSEU NEGATIVE  HGBUR TRACE*  BILIRUBINUR NEGATIVE  KETONESUR NEGATIVE  PROTEINUR >300*  UROBILINOGEN 0.2  NITRITE NEGATIVE  LEUKOCYTESUR NEGATIVE   Micro Results: Recent Results (from the past 240 hour(s))  Urine culture     Status: None   Collection Time:  07/19/14 11:51 AM  Result Value Ref Range Status   Specimen Description URINE, CATHETERIZED  Final   Special Requests NONE  Final   Culture  Setup Time   Final    07/19/2014 21:31 Performed at [facility] Performed at [facility]   Final   Culture NO GROWTH Performed at [facility]   Final   Report Status 07/20/2014 FINAL  Final  Culture, blood (routine x 2)     Status: None (Preliminary result)   Collection Time: 07/19/14 12:25 PM  Result Value Ref Range Status   Specimen Description BLOOD RIGHT  HAND  Final   Special Requests BOTTLES DRAWN AEROBIC AND ANAEROBIC 5 CC  Final   Culture  Setup Time   Final    07/19/2014 18:32 Performed at [facility]    Culture   Final           BLOOD CULTURE RECEIVED NO GROWTH TO DATE CULTURE WILL BE HELD FOR 5 DAYS BEFORE ISSUING A FINAL NEGATIVE REPORT Performed at [facility]    Report Status PENDING  Incomplete  Culture, blood (routine x 2)     Status: None (Preliminary result)   Collection Time: 07/19/14 12:25 PM  Result Value Ref Range Status   Specimen Description BLOOD LEFT HAND  Final   Special Requests BOTTLES DRAWN AEROBIC AND ANAEROBIC 5ML  Final   Culture  Setup Time   Final    07/19/2014 18:32 Performed at [facility]    Culture   Final           BLOOD CULTURE RECEIVED NO GROWTH TO DATE CULTURE WILL BE HELD FOR 5 DAYS BEFORE ISSUING A FINAL NEGATIVE REPORT Performed at [facility]    Report Status PENDING  Incomplete  MRSA PCR Screening     Status: None   Collection Time: 07/19/14  6:37 PM  Result Value Ref Range Status   MRSA by PCR NEGATIVE NEGATIVE Final    Comment:        The GeneXpert MRSA Assay (FDA approved for NASAL specimens only), is one component of a comprehensive MRSA colonization surveillance program. It is not intended to diagnose MRSA infection nor to guide or monitor treatment for MRSA infections.    Studies/Results: No results found. Medications: I have reviewed the patient's current medications. Scheduled Meds: . antiseptic oral rinse  7 mL Mouth Rinse BID  . donepezil  5 mg Oral QHS  . doxycycline  100 mg Oral Q12H  . feeding supplement (ENSURE COMPLETE)  237 mL Oral BID BM  . insulin aspart  0-9 Units Subcutaneous TID WC  . [START ON 07/23/2014] levofloxacin  500 mg Oral Q48H  . propranolol  40 mg Oral BID  . simvastatin  10 mg Oral q1800   Continuous Infusions:  PRN Meds:.acetaminophen, albuterol, polyvinyl alcohol Assessment/Plan: Principal  Problem:   HCAP (healthcare-associated pneumonia) Active Problems:   AKI (acute kidney injury)  Patient is a 80 year old female with a history of dementia, coronary artery disease status post stenting, hyperlipidemia, type 2 diabetes, decubitus ulcers, recent shoulder trauma who presents with healthcare associated pneumonia.   HCAP: Patient has defervesced with no fevers since initial presentation. Unable to track white counts since patient has been difficult to phlebotomize. Lung exam improved over the interval. Patient alert and oriented 4. Patient started on antibiotics on 07/19/2014, initially vancomycin and aztreonam, transitioned to vancomycin and cefepime. -Patient transitioned to oral  levofloxacin and doxycycline yesterday. -blood cultures x 2 NGTD - UA negative, urine culture NGTD - PT/OT consulted  Urinary retention: Patient has been having persistent urinary retention during this admission treated with clean intermittent catheterization. Patient is not on any medications that are associated with urinary retention. Catheterizations have also become increasingly difficult.  - Foley catheter for 48-72 hours with reassessment by SNF.  CAD s/p stent: Presenting EKG concerning for [facility] T waves. Troponin trending down from 0.19 to 0.15 overnight. Patient denies any chest pain. Given age and goals of care, further cardiology workup and therapy would likely be low yield. - decreased home propanolol from 60mg  BID to 40mg  2/2 to bradycardia ( HR around 55's)  Type 2 diabetes: hemoglobin A1c's 7.3 on 06/30/14. CBGs in the high 100s to low 200s.  - Sliding-scale insulin (sensitive) - Hold home Januvia and glipizide  Anion gap acidosis: Found on admission but no serial labs as above. Bicarbonate 21, anion gap 18. No ketones in the urine. Likely 2/2 to starvation ketosis. Pt has had decrease po intake.  -Continue to monitor if possible  Dementia:  - continue home Aricept 5mg   qhs  Hyperlipidemia: - continue home Zocor 10 mg daily at bedtime   Decubitus ulcers - wound care consulted  Diet: Dysphagia 1 Prophylaxis: SCDs  Code: DNR/DNI, pt has paperwork with her.   Dispo: Disposition is deferred at this time, awaiting improvement of current medical problems. Anticipated discharge in approximately 0-1 day(s).  -Trial to void in 2-3 days (12/25-12/26). Urology referral if not successful.  -Levofloxacin and doxycycline for 10 days with planned end date of 07/28/14).   The patient does have a current PCP (Rafaela M. Ruben Gottron, MD) and does not need an [facility] hospital follow-up appointment after discharge.  Home medication changes: propanolol titrated down to 40 mg from 60 mg 2/2 bradycardia. Labs: Deferred due to difficult lab draw and patient comfort.   The patient does not have transportation limitations that hinder transportation to clinic appointments.  .Services Needed at time of discharge: Y = Yes, Blank = No PT:   OT:   RN:   Equipment:   Other:     LOS: 3 days   Luan Moore, MD PhD  07/22/2014, 8:48 AM

## 2014-07-22 NOTE — Discharge Instructions (Signed)
Please follow up with Dr. Ruben Gottron on January 6 at 3 pm. Please continue taking your antibiotics until 07/28/14. Please reduce your home dose of propranolol to 40 mg from 60 mg. If you are not able to urinate without a foley catheter in 2-3 days (December 25 26), please seek further evaluation by a urologist. Pneumonia, Adult Pneumonia is an infection of the lungs. It may be caused by a germ (virus or bacteria). Some types of pneumonia can spread easily from person to person. This can happen when you cough or sneeze. HOME CARE  Only take medicine as told by your doctor.  Take your medicine (antibiotics) as told. Finish it even if you start to feel better.  Do not smoke.  You may use a vaporizer or humidifier in your room. This can help loosen thick spit (mucus).  Sleep so you are almost sitting up (semi-upright). This helps reduce coughing.  Rest. A shot (vaccine) can help prevent pneumonia. Shots are often advised for:  People over 86 years old.  Patients on chemotherapy.  People with long-term (chronic) lung problems.  People with immune system problems. GET HELP RIGHT AWAY IF:   You are getting worse.  You cannot control your cough, and you are losing sleep.  You cough up blood.  Your pain gets worse, even with medicine.  You have a fever.  Any of your problems are getting worse, not better.  You have shortness of breath or chest pain. MAKE SURE YOU:   Understand these instructions.  Will watch your condition.  Will get help right away if you are not doing well or get worse. Document Released: 01/03/2008 Document Revised: 10/09/2011 Document Reviewed: 10/07/2010 [facility] Patient Information 2015 [place], [place]. This information is not intended to replace advice given to you by your health care provider. Make sure you discuss any questions you have with your health care provider.

## 2014-07-22 NOTE — Progress Notes (Signed)
Pt seen and examined with Dr. Raelene Bott. Case d/w residents and plan formulated on morning rounds. Please refer to resident note for details  Pt feels well this AM. No new complaints. RN reports patient with recurrent episodes of urinary retention requiring catheterization  Physical Exam: Gen: AAO*3, NAD CVS: RRR, normal heart sounds Pulm: bibasilar crackles + Abd: soft, non tender, BS + Ext: no edema  Assessment and Plan: 80 y/o female who p/w SOB, hypoxia found to have HCAP  HCAP: - Pt with no fevers today - Pt was noted to have b/l infiltrates on her chest x ray and procalcitonin of 4 with an elevated WBC all consistent with PNA - IV abx discontinued. C/w levofloxacin and doxycycline to complete 7 day course - f/u blood cx - NGTD - Pt eval noted- recommend SNF on discharge  Urinary retention: - Pt with repeated episodes of retention. WIll place Foley catheter for 48 hrs - 72 hrs and then do a trial of void at SNF - if persistent retention will need follow up with urology as an outpatient  Dementia: - c/w aricept  Bradycardia: - HR stable on lower dose of propanolol - Troponins are negative and pt is asymptomatic. No further w/u for EKG changes at this time - Outpatient f/u with PCP   Pt is stable for d/c to SNF today

## 2014-07-22 NOTE — Progress Notes (Signed)
ANTIBIOTIC CONSULT NOTE - FOLLOW UP  Pharmacy Consult for Levaquin Indication: pneumonia  Allergies  Allergen Reactions  . Cephalexin Other (See Comments)    Confusion/Altered mental status per family     Patient Measurements: Height: 4\' 9"  (144.8 cm) Weight: 124 lb (56.246 kg) (bedscale) IBW/kg (Calculated) : 38.6 Adjusted Body Weight:    Vital Signs: Temp: 97.6 F (36.4 C) (12/23 0629) Temp Source: Oral (12/23 0629) BP: 147/62 mmHg (12/23 0629) Pulse Rate: 94 (12/23 0629) Intake/Output from previous day: 12/22 0701 - 12/23 0700 In: 140 [P.O.:140] Out: 400 [Urine:400] Intake/Output from this shift: Total I/O In: 100 [P.O.:100] Out: 0   Labs:  Recent Labs  07/19/14 1222 07/21/14 0741  WBC 12.5* 12.4*  HGB 10.1* 10.3*  PLT 303 256  CREATININE 1.68* 1.67*   Estimated Creatinine Clearance: 13.2 mL/min (by C-G formula based on Cr of 1.67). No results for input(s): VANCOTROUGH, VANCOPEAK, VANCORANDOM, GENTTROUGH, GENTPEAK, GENTRANDOM, TOBRATROUGH, TOBRAPEAK, TOBRARND, AMIKACINPEAK, AMIKACINTROU, AMIKACIN in the last 72 hours.     Assessment: 80yo female admitted with resp. distress and decreased LOC from [facility].She has an allergy documented for Cephalexin which causes confusion per family. Abx for HCAP.  Anticoagulation: SCDs ordered.  Infectious Disease: HCAP. Afebrile. WBC 12.4 no change. Cr 1.67 no change (CrCl 13). Transition to Levaquin. Vanco 12/20>>12/22 Aztreonam 12/20>>12/21 Cefepime 12/21>>12/22 Levaquin 12/22  12/20: UCx negative 12/20: BC x 2: pending  Goal of Therapy:  Renally adjusted treatment for HCAP  Plan:  Levaquin 500mg  q48 hr. Dose adequate for CrCl<50 Pharmacy will sign off.   Alynna Hargrove S. Alford Highland, PharmD, BCPS Clinical Staff Pharmacist Pager [phone]  [place], [place] 07/22/2014,9:34 AM

## 2014-07-23 NOTE — Progress Notes (Signed)
Ok per MD for d/c today back to [facility] via EMS. CSW notified patient's granddaughter Grover Canavan who was pleased with d.c arrangement.  CSW worked with Elzie Rings, Engineer, site at [employer] who was able to obtain [employer] (not [employer]) number for patient prior to d/c.  Nursing notified to call report.  Patient is alert but confused.  CSW signing off.  Lorie Phenix. Pauline Good, [place]

## 2014-07-25 LAB — CULTURE, BLOOD (ROUTINE X 2)
Culture: NO GROWTH
Culture: NO GROWTH

## 2014-08-17 NOTE — Clinical Social Work Psychosocial (Addendum)
    Clinical Social Work Department BRIEF PSYCHOSOCIAL ASSESSMENT 07/22/2015  Patient:  Molly Ashley, Molly Ashley     Account Number:  [id number]     Admit date:  07/19/2014  Clinical Social Worker:  Elam Dutch  Date/Time:  07/21/2014 01:47 PM  Referred by:  Physician  Date Referred:  07/21/2014 Referred for  Other - See comment   Other Referral:   From SNF- return there   Interview type:  Family Other interview type:   [place]    PSYCHOSOCIAL DATA Living Status:  FACILITY Admitted from facility:  [facility] Level of care:  [place] Primary support name:  Molly Ashley Primary support relationship to patient:  FAMILY Degree of support available:   Strong support    CURRENT CONCERNS Current Concerns  Other - See comment   Other Concerns:   Return to SNF    SOCIAL WORK ASSESSMENT / PLAN 81 year old female- resident of [facility].  Patient is a long term care patient at the facility.  CSW spoke to her granddaughter Molly Ashley. She wants patient to return to the facility when medically stable.  CSW spoke to Admissions at facility- willl accept patient back when medically stable. Fl2 placed on chart for MD's signature.   Assessment/plan status:  Psychosocial Support/Ongoing Assessment of Needs Other assessment/ plan:   Information/referral to community resources:   None at this time.    PATIENT'S/FAMILY'S RESPONSE TO PLAN OF CARE: Patient is alert and oriented to person only. She smiles and responds "yes" when asked if she wants to return to [facility]. She is uanble to respond to plan of care.  CSW provided support and reassurance.  Granddaughter Molly Ashley wants return to facility when stable.  Will monitor for date of dc.

## 2014-11-26 ENCOUNTER — Encounter (HOSPITAL_COMMUNITY): Payer: Self-pay | Admitting: Emergency Medicine

## 2014-11-26 ENCOUNTER — Inpatient Hospital Stay (HOSPITAL_COMMUNITY)
Admission: EM | Admit: 2014-11-26 | Discharge: 2014-12-01 | DRG: 689 | Disposition: A | Discharge status: Skilled Nursing Facility | Payer: Medicare HMO | Attending: Internal Medicine | Admitting: Internal Medicine

## 2014-11-26 DIAGNOSIS — R531 Weakness: Secondary | ICD-10-CM | POA: Diagnosis not present

## 2014-11-26 DIAGNOSIS — N189 Chronic kidney disease, unspecified: Secondary | ICD-10-CM

## 2014-11-26 DIAGNOSIS — E785 Hyperlipidemia, unspecified: Secondary | ICD-10-CM | POA: Diagnosis present

## 2014-11-26 DIAGNOSIS — E11649 Type 2 diabetes mellitus with hypoglycemia without coma: Secondary | ICD-10-CM | POA: Diagnosis present

## 2014-11-26 DIAGNOSIS — Z66 Do not resuscitate: Secondary | ICD-10-CM | POA: Diagnosis present

## 2014-11-26 DIAGNOSIS — N179 Acute kidney failure, unspecified: Secondary | ICD-10-CM | POA: Diagnosis present

## 2014-11-26 DIAGNOSIS — Z90710 Acquired absence of both cervix and uterus: Secondary | ICD-10-CM

## 2014-11-26 DIAGNOSIS — E119 Type 2 diabetes mellitus without complications: Secondary | ICD-10-CM

## 2014-11-26 DIAGNOSIS — F0390 Unspecified dementia without behavioral disturbance: Secondary | ICD-10-CM | POA: Diagnosis present

## 2014-11-26 DIAGNOSIS — L89622 Pressure ulcer of left heel, stage 2: Secondary | ICD-10-CM | POA: Diagnosis present

## 2014-11-26 DIAGNOSIS — D649 Anemia, unspecified: Secondary | ICD-10-CM

## 2014-11-26 DIAGNOSIS — Z888 Allergy status to other drugs, medicaments and biological substances status: Secondary | ICD-10-CM

## 2014-11-26 DIAGNOSIS — D638 Anemia in other chronic diseases classified elsewhere: Secondary | ICD-10-CM | POA: Diagnosis present

## 2014-11-26 DIAGNOSIS — N390 Urinary tract infection, site not specified: Principal | ICD-10-CM | POA: Diagnosis present

## 2014-11-26 DIAGNOSIS — R0602 Shortness of breath: Secondary | ICD-10-CM | POA: Insufficient documentation

## 2014-11-26 DIAGNOSIS — F05 Delirium due to known physiological condition: Secondary | ICD-10-CM | POA: Diagnosis present

## 2014-11-26 DIAGNOSIS — M1990 Unspecified osteoarthritis, unspecified site: Secondary | ICD-10-CM | POA: Diagnosis present

## 2014-11-26 DIAGNOSIS — N184 Chronic kidney disease, stage 4 (severe): Secondary | ICD-10-CM | POA: Diagnosis present

## 2014-11-26 DIAGNOSIS — Z96611 Presence of right artificial shoulder joint: Secondary | ICD-10-CM | POA: Diagnosis present

## 2014-11-26 DIAGNOSIS — J189 Pneumonia, unspecified organism: Secondary | ICD-10-CM | POA: Diagnosis not present

## 2014-11-26 DIAGNOSIS — D72829 Elevated white blood cell count, unspecified: Secondary | ICD-10-CM | POA: Insufficient documentation

## 2014-11-26 DIAGNOSIS — E876 Hypokalemia: Secondary | ICD-10-CM | POA: Diagnosis present

## 2014-11-26 LAB — CBC WITH DIFFERENTIAL/PLATELET
Basophils Absolute: 0 10*3/uL (ref 0.0–0.1)
Basophils Relative: 0 % (ref 0–1)
Eosinophils Absolute: 0 10*3/uL (ref 0.0–0.7)
Eosinophils Relative: 0 % (ref 0–5)
HCT: 27.3 % — ABNORMAL LOW (ref 36.0–46.0)
HEMOGLOBIN: 8.9 g/dL — AB (ref 12.0–15.0)
Lymphocytes Relative: 3 % — ABNORMAL LOW (ref 12–46)
Lymphs Abs: 0.8 10*3/uL (ref 0.7–4.0)
MCH: 30.2 pg (ref 26.0–34.0)
MCHC: 32.6 g/dL (ref 30.0–36.0)
MCV: 92.5 fL (ref 78.0–100.0)
MONOS PCT: 7 % (ref 3–12)
Monocytes Absolute: 1.6 10*3/uL — ABNORMAL HIGH (ref 0.1–1.0)
NEUTROS ABS: 20.1 10*3/uL — AB (ref 1.7–7.7)
Neutrophils Relative %: 90 % — ABNORMAL HIGH (ref 43–77)
Platelets: 261 10*3/uL (ref 150–400)
RBC: 2.95 MIL/uL — ABNORMAL LOW (ref 3.87–5.11)
RDW: 13.8 % (ref 11.5–15.5)
WBC: 22.6 10*3/uL — ABNORMAL HIGH (ref 4.0–10.5)

## 2014-11-26 LAB — BASIC METABOLIC PANEL
ANION GAP: 13 (ref 5–15)
BUN: 36 mg/dL — ABNORMAL HIGH (ref 6–23)
CALCIUM: 8.3 mg/dL — AB (ref 8.4–10.5)
CO2: 17 mmol/L — AB (ref 19–32)
CREATININE: 2.13 mg/dL — AB (ref 0.50–1.10)
Chloride: 106 mmol/L (ref 96–112)
GFR calc Af Amer: 21 mL/min — ABNORMAL LOW (ref >90)
GFR, EST NON AFRICAN AMERICAN: 18 mL/min — AB (ref >90)
Glucose, Bld: 174 mg/dL — ABNORMAL HIGH (ref 70–99)
Potassium: 3.1 mmol/L — ABNORMAL LOW (ref 3.5–5.1)
Sodium: 136 mmol/L (ref 135–145)

## 2014-11-26 LAB — URINALYSIS, ROUTINE W REFLEX MICROSCOPIC
BILIRUBIN URINE: NEGATIVE
GLUCOSE, UA: 100 mg/dL — AB
KETONES UR: NEGATIVE mg/dL
Nitrite: NEGATIVE
PH: 5.5 (ref 5.0–8.0)
Protein, ur: >300 mg/dL — AB
Specific Gravity, Urine: 1.015 (ref 1.005–1.030)
UROBILINOGEN UA: 1 mg/dL (ref 0.0–1.0)

## 2014-11-26 LAB — URINE MICROSCOPIC-ADD ON

## 2014-11-26 LAB — I-STAT CG4 LACTIC ACID, ED: Lactic Acid, Venous: 1.26 mmol/L (ref 0.5–2.0)

## 2014-11-26 MED ORDER — LEVOFLOXACIN IN D5W 500 MG/100ML IV SOLN
500.0000 mg | INTRAVENOUS | Status: DC
  Administered 2014-11-26: 500 mg via INTRAVENOUS
  Filled 2014-11-26 (×2): qty 100

## 2014-11-26 MED ORDER — SODIUM CHLORIDE 0.9 % IV SOLN: INTRAVENOUS | Status: DC

## 2014-11-26 MED ORDER — ACETAMINOPHEN 325 MG PO TABS
650.0000 mg | ORAL_TABLET | Freq: Once | ORAL | Status: AC
  Administered 2014-11-26: 650 mg via ORAL
  Filled 2014-11-26: qty 2

## 2014-11-26 MED ORDER — SODIUM CHLORIDE 0.9 % IV SOLN
INTRAVENOUS | Status: AC
  Administered 2014-11-26 – 2014-11-27 (×2): via INTRAVENOUS

## 2014-11-26 NOTE — ED Notes (Signed)
Pt to ED via [facility] from home.  Family [facility] pt was at her MD's office earlier today and was diagnosed with urinary tract infection and placed on antibiotics.  [facility] was called by her MD and told that pt's white count was 24,000 and pt needed to come to ED.  Pt alert and oriented, denies any pain or discomfort at this time.  Family at bedside

## 2014-11-26 NOTE — ED Notes (Signed)
Admitting MD at bedside to assess pt for admission 

## 2014-11-26 NOTE — ED Provider Notes (Signed)
CSN: [id number]     Arrival date & time 11/26/14  1955 History   First MD Initiated Contact with Patient 11/26/14 2019     Chief Complaint  Patient presents with  . Urinary Tract Infection     (Consider location/radiation/quality/duration/timing/severity/associated sxs/prior Treatment) HPI Comments: Patient here complaining of fever and UTI. Seen by her physician this morning and diagnosed with a UTI and placed on oral antibiotics. She had blood work performed which resulted this evening and she was called because the white blood cell count of 24,000. He denies any vomiting or diarrhea. Denies any cough but has been short of breath. No neck pain or headache. No photophobia. Denies any abdominal or chest pain. Symptoms persistent and nothing makes them better. Does note some dysuria without vaginal discharge or bleeding  Patient is a 81 y.o. female presenting with urinary tract infection. The history is provided by the patient.  Urinary Tract Infection    Past Medical History  Diagnosis Date  . Arthritis   . Cancer   . Diabetes mellitus without complication   . Cataract    Past Surgical History  Procedure Laterality Date  . Abdominal hysterectomy    . Eye surgery     No family history on file. History  Substance Use Topics  . Smoking status: Never Smoker   . Smokeless tobacco: Never Used  . Alcohol Use: No   OB History    No data available     Review of Systems  All other systems reviewed and are negative.     Allergies  Cephalexin  Home Medications   Prior to Admission medications   Medication Sig Start Date End Date Taking? Authorizing Provider  acetaminophen (TYLENOL) 500 MG tablet Take 500 mg by mouth every 6 (six) hours as needed (for pain.).    Historical Provider, MD  donepezil (ARICEPT) 5 MG tablet Take 5 mg by mouth at bedtime.  06/02/14   Historical Provider, MD  glipiZIDE (GLUCOTROL) 5 MG tablet Take 5 mg by mouth daily.  03/23/13   Historical Provider,  MD  Polyethyl Glycol-Propyl Glycol (SYSTANE OP) Apply 1 drop to eye as needed (dry eyes).    Historical Provider, MD  propranolol (INDERAL) 40 MG tablet Take 1 tablet (40 mg total) by mouth 2 (two) times daily. 07/22/14   Luan Moore, MD  simvastatin (ZOCOR) 10 MG tablet Take 10 mg by mouth daily.    Historical Provider, MD  sitaGLIPtin (JANUVIA) 25 MG tablet Take 25 mg by mouth every other day.     Historical Provider, MD   BP 126/82 mmHg  Pulse 84  Temp(Src) 101.3 F (38.5 C) (Oral)  Resp 18  Ht 4\' 9"  (1.448 m)  Wt 105 lb (47.628 kg)  BMI 22.72 kg/m2  SpO2 96% Physical Exam  Constitutional: She is oriented to person, place, and time. She appears well-developed and well-nourished.  Non-toxic appearance. No distress.  HENT:  Head: Normocephalic and atraumatic.  Eyes: Conjunctivae, EOM and lids are normal. Pupils are equal, round, and reactive to light.  Neck: Normal range of motion. Neck supple. No tracheal deviation present. No thyroid mass present.  Cardiovascular: Normal rate, regular rhythm and normal heart sounds.  Exam reveals no gallop.   No murmur heard. Pulmonary/Chest: Effort normal and breath sounds normal. No stridor. No respiratory distress. She has no decreased breath sounds. She has no wheezes. She has no rhonchi. She has no rales.  Abdominal: Soft. Normal appearance and bowel sounds are normal. She exhibits  no distension. There is no tenderness. There is no rebound and no CVA tenderness.  Musculoskeletal: Normal range of motion. She exhibits no edema or tenderness.  Neurological: She is alert and oriented to person, place, and time. She has normal strength. No cranial nerve deficit or sensory deficit. GCS eye subscore is 4. GCS verbal subscore is 5. GCS motor subscore is 6.  Skin: Skin is warm and dry. No abrasion and no rash noted.  Psychiatric: She has a normal mood and affect. Her speech is normal and behavior is normal.  Nursing note and vitals reviewed.   ED  Course  Procedures (including critical care time) Labs Review Labs Reviewed  URINE CULTURE  CULTURE, BLOOD (ROUTINE X 2)  CULTURE, BLOOD (ROUTINE X 2)  CBC WITH DIFFERENTIAL/PLATELET  BASIC METABOLIC PANEL  URINALYSIS, ROUTINE W REFLEX MICROSCOPIC  I-STAT CG4 LACTIC ACID, ED    Imaging Review No results found.   EKG Interpretation None      MDM   Final diagnoses:  None    Patient placed on Levaquin to be dosed by pharmacy for her UTI. Will be admitted to the medicine service   Lacretia Leigh, MD 11/26/14 [phone]

## 2014-11-26 NOTE — Progress Notes (Signed)
ANTIBIOTIC CONSULT NOTE - INITIAL  Pharmacy Consult for Levaquin Indication: Urosepsis  Allergies  Allergen Reactions  . Cephalexin Other (See Comments)    Confusion/Altered mental status per family     Patient Measurements: Height: 4\' 9"  (144.8 cm) Weight: 105 lb (47.628 kg) IBW/kg (Calculated) : 38.6 Adjusted Body Weight:   Vital Signs: Temp: 101.3 F (38.5 C) (04/28 2026) Temp Source: Oral (04/28 2026) BP: 155/62 mmHg (04/28 2127) Pulse Rate: 73 (04/28 2127) Intake/Output from previous day:   Intake/Output from this shift:    Labs:  Recent Labs  11/26/14 2053  WBC 22.6*  HGB 8.9*  PLT 261  CREATININE 2.13*   Estimated Creatinine Clearance: 9.4 mL/min (by C-G formula based on Cr of 2.13). No results for input(s): VANCOTROUGH, VANCOPEAK, VANCORANDOM, GENTTROUGH, GENTPEAK, GENTRANDOM, TOBRATROUGH, TOBRAPEAK, TOBRARND, AMIKACINPEAK, AMIKACINTROU, AMIKACIN in the last 72 hours.   Microbiology: No results found for this or any previous visit (from the past 720 hour(s)).  Medical History: Past Medical History  Diagnosis Date  . Arthritis   . Cancer   . Diabetes mellitus without complication   . Cataract     Medications:   (Not in a hospital admission) Scheduled:   Infusions:  . sodium chloride 125 mL/hr at 11/26/14 2117   Assessment: 81yo female with history of DM2 presents with fever and UTI. Pharmacy is consulted to dose levaquin for urosepsis. Pt is febrile to 101.3, WBC 22.6, sCr 2.13, LA 1.3.  Goal of Therapy:  Eradication of infection  Plan:  Levaquin 500mg  IV q48h Follow up culture results, renal function, QTc, and clinical course  Andrey Cota. Diona Foley, PharmD Clinical Pharmacist Pager [phone] 11/26/2014,10:24 PM

## 2014-11-27 ENCOUNTER — Emergency Department (HOSPITAL_COMMUNITY): Payer: Medicare HMO

## 2014-11-27 DIAGNOSIS — N390 Urinary tract infection, site not specified: Secondary | ICD-10-CM | POA: Diagnosis present

## 2014-11-27 DIAGNOSIS — N179 Acute kidney failure, unspecified: Secondary | ICD-10-CM | POA: Diagnosis present

## 2014-11-27 DIAGNOSIS — E1122 Type 2 diabetes mellitus with diabetic chronic kidney disease: Secondary | ICD-10-CM

## 2014-11-27 DIAGNOSIS — E785 Hyperlipidemia, unspecified: Secondary | ICD-10-CM

## 2014-11-27 DIAGNOSIS — L89622 Pressure ulcer of left heel, stage 2: Secondary | ICD-10-CM | POA: Diagnosis present

## 2014-11-27 DIAGNOSIS — Z888 Allergy status to other drugs, medicaments and biological substances status: Secondary | ICD-10-CM | POA: Diagnosis not present

## 2014-11-27 DIAGNOSIS — R531 Weakness: Secondary | ICD-10-CM | POA: Insufficient documentation

## 2014-11-27 DIAGNOSIS — Z96611 Presence of right artificial shoulder joint: Secondary | ICD-10-CM | POA: Diagnosis present

## 2014-11-27 DIAGNOSIS — F0390 Unspecified dementia without behavioral disturbance: Secondary | ICD-10-CM | POA: Diagnosis present

## 2014-11-27 DIAGNOSIS — E11649 Type 2 diabetes mellitus with hypoglycemia without coma: Secondary | ICD-10-CM | POA: Diagnosis present

## 2014-11-27 DIAGNOSIS — N189 Chronic kidney disease, unspecified: Secondary | ICD-10-CM

## 2014-11-27 DIAGNOSIS — D72829 Elevated white blood cell count, unspecified: Secondary | ICD-10-CM | POA: Insufficient documentation

## 2014-11-27 DIAGNOSIS — L89629 Pressure ulcer of left heel, unspecified stage: Secondary | ICD-10-CM

## 2014-11-27 DIAGNOSIS — D649 Anemia, unspecified: Secondary | ICD-10-CM

## 2014-11-27 DIAGNOSIS — B9689 Other specified bacterial agents as the cause of diseases classified elsewhere: Secondary | ICD-10-CM

## 2014-11-27 DIAGNOSIS — E119 Type 2 diabetes mellitus without complications: Secondary | ICD-10-CM

## 2014-11-27 DIAGNOSIS — Z66 Do not resuscitate: Secondary | ICD-10-CM | POA: Diagnosis present

## 2014-11-27 DIAGNOSIS — E876 Hypokalemia: Secondary | ICD-10-CM | POA: Diagnosis present

## 2014-11-27 DIAGNOSIS — J189 Pneumonia, unspecified organism: Secondary | ICD-10-CM | POA: Diagnosis not present

## 2014-11-27 DIAGNOSIS — N184 Chronic kidney disease, stage 4 (severe): Secondary | ICD-10-CM | POA: Diagnosis present

## 2014-11-27 DIAGNOSIS — M1990 Unspecified osteoarthritis, unspecified site: Secondary | ICD-10-CM | POA: Diagnosis present

## 2014-11-27 DIAGNOSIS — Z90710 Acquired absence of both cervix and uterus: Secondary | ICD-10-CM | POA: Diagnosis not present

## 2014-11-27 DIAGNOSIS — D638 Anemia in other chronic diseases classified elsewhere: Secondary | ICD-10-CM | POA: Diagnosis present

## 2014-11-27 DIAGNOSIS — F05 Delirium due to known physiological condition: Secondary | ICD-10-CM | POA: Diagnosis present

## 2014-11-27 LAB — CBC WITH DIFFERENTIAL/PLATELET
Basophils Absolute: 0 10*3/uL (ref 0.0–0.1)
Basophils Relative: 0 % (ref 0–1)
EOS ABS: 0 10*3/uL (ref 0.0–0.7)
EOS PCT: 0 % (ref 0–5)
HCT: 26.4 % — ABNORMAL LOW (ref 36.0–46.0)
Hemoglobin: 8.6 g/dL — ABNORMAL LOW (ref 12.0–15.0)
LYMPHS ABS: 2.2 10*3/uL (ref 0.7–4.0)
LYMPHS PCT: 9 % — AB (ref 12–46)
MCH: 30.2 pg (ref 26.0–34.0)
MCHC: 32.6 g/dL (ref 30.0–36.0)
MCV: 92.6 fL (ref 78.0–100.0)
MONOS PCT: 9 % (ref 3–12)
Monocytes Absolute: 2.3 10*3/uL — ABNORMAL HIGH (ref 0.1–1.0)
NEUTROS ABS: 20.4 10*3/uL — AB (ref 1.7–7.7)
NEUTROS PCT: 82 % — AB (ref 43–77)
PLATELETS: 285 10*3/uL (ref 150–400)
RBC: 2.85 MIL/uL — ABNORMAL LOW (ref 3.87–5.11)
RDW: 13.8 % (ref 11.5–15.5)
WBC: 25 10*3/uL — ABNORMAL HIGH (ref 4.0–10.5)

## 2014-11-27 LAB — COMPREHENSIVE METABOLIC PANEL
ALT: 31 U/L (ref 0–35)
ANION GAP: 11 (ref 5–15)
AST: 45 U/L — AB (ref 0–37)
Albumin: 2.2 g/dL — ABNORMAL LOW (ref 3.5–5.2)
Alkaline Phosphatase: 79 U/L (ref 39–117)
BILIRUBIN TOTAL: 0.7 mg/dL (ref 0.3–1.2)
BUN: 36 mg/dL — ABNORMAL HIGH (ref 6–23)
CALCIUM: 8 mg/dL — AB (ref 8.4–10.5)
CHLORIDE: 109 mmol/L (ref 96–112)
CO2: 18 mmol/L — ABNORMAL LOW (ref 19–32)
Creatinine, Ser: 2.19 mg/dL — ABNORMAL HIGH (ref 0.50–1.10)
GFR calc Af Amer: 20 mL/min — ABNORMAL LOW (ref >90)
GFR calc non Af Amer: 17 mL/min — ABNORMAL LOW (ref >90)
GLUCOSE: 49 mg/dL — AB (ref 70–99)
Potassium: 2.6 mmol/L — CL (ref 3.5–5.1)
Sodium: 138 mmol/L (ref 135–145)
Total Protein: 5.5 g/dL — ABNORMAL LOW (ref 6.0–8.3)

## 2014-11-27 LAB — BASIC METABOLIC PANEL
Anion gap: 9 (ref 5–15)
BUN: 37 mg/dL — ABNORMAL HIGH (ref 6–23)
CHLORIDE: 112 mmol/L (ref 96–112)
CO2: 18 mmol/L — ABNORMAL LOW (ref 19–32)
Calcium: 8 mg/dL — ABNORMAL LOW (ref 8.4–10.5)
Creatinine, Ser: 1.98 mg/dL — ABNORMAL HIGH (ref 0.50–1.10)
GFR calc non Af Amer: 20 mL/min — ABNORMAL LOW (ref >90)
GFR, EST AFRICAN AMERICAN: 23 mL/min — AB (ref >90)
Glucose, Bld: 78 mg/dL (ref 70–99)
Potassium: 3.9 mmol/L (ref 3.5–5.1)
SODIUM: 139 mmol/L (ref 135–145)

## 2014-11-27 LAB — GLUCOSE, CAPILLARY
GLUCOSE-CAPILLARY: 48 mg/dL — AB (ref 70–99)
GLUCOSE-CAPILLARY: 96 mg/dL (ref 70–99)
GLUCOSE-CAPILLARY: 38 mg/dL — AB (ref 70–99)
GLUCOSE-CAPILLARY: 75 mg/dL (ref 70–99)
Glucose-Capillary: 89 mg/dL (ref 70–99)
Glucose-Capillary: 90 mg/dL (ref 70–99)
Glucose-Capillary: 90 mg/dL (ref 70–99)

## 2014-11-27 LAB — LACTIC ACID, PLASMA: LACTIC ACID, VENOUS: 0.8 mmol/L (ref 0.5–2.0)

## 2014-11-27 MED ORDER — HEPARIN SODIUM (PORCINE) 5000 UNIT/ML IJ SOLN
5000.0000 [IU] | Freq: Three times a day (TID) | INTRAMUSCULAR | Status: DC
  Administered 2014-11-27 – 2014-11-30 (×11): 5000 [IU] via SUBCUTANEOUS
  Filled 2014-11-27 (×16): qty 1

## 2014-11-27 MED ORDER — POTASSIUM CHLORIDE CRYS ER 20 MEQ PO TBCR
40.0000 meq | EXTENDED_RELEASE_TABLET | ORAL | Status: DC
  Administered 2014-11-27: 40 meq via ORAL
  Filled 2014-11-27: qty 2

## 2014-11-27 MED ORDER — INSULIN ASPART 100 UNIT/ML ~~LOC~~ SOLN: 0.0000 [IU] | Freq: Three times a day (TID) | SUBCUTANEOUS | Status: DC

## 2014-11-27 MED ORDER — SODIUM CHLORIDE 0.9 % IJ SOLN
3.0000 mL | Freq: Two times a day (BID) | INTRAMUSCULAR | Status: DC
  Administered 2014-11-27 – 2014-11-30 (×6): 3 mL via INTRAVENOUS

## 2014-11-27 MED ORDER — ACETAMINOPHEN 325 MG PO TABS
650.0000 mg | ORAL_TABLET | Freq: Four times a day (QID) | ORAL | Status: DC | PRN
  Administered 2014-11-28 – 2014-11-30 (×2): 650 mg via ORAL
  Filled 2014-11-27 (×2): qty 2

## 2014-11-27 MED ORDER — POTASSIUM CHLORIDE CRYS ER 20 MEQ PO TBCR
40.0000 meq | EXTENDED_RELEASE_TABLET | Freq: Once | ORAL | Status: AC
  Administered 2014-11-27: 40 meq via ORAL
  Filled 2014-11-27: qty 2

## 2014-11-27 MED ORDER — SODIUM CHLORIDE 0.9 % IV SOLN: INTRAVENOUS | Status: DC

## 2014-11-27 MED ORDER — POLYVINYL ALCOHOL 1.4 % OP SOLN
1.0000 [drp] | OPHTHALMIC | Status: DC | PRN
  Filled 2014-11-27: qty 15

## 2014-11-27 NOTE — Plan of Care (Signed)
Problem: Phase I Progression Outcomes Goal: OOB as tolerated unless otherwise ordered Outcome: Completed/Met Date Met:  11/27/14 OOB to chair today Goal: Initial discharge plan identified Outcome: Completed/Met Date Met:  11/27/14 To return home with granddaughter

## 2014-11-27 NOTE — Progress Notes (Signed)
Hypoglycemic Event  CBG: 48 @ 0748 informed @ 0757  Treatment: 15 GM carbohydrate snack  Symptoms: None  Follow-up CBG: Time:0816 CBG Result:89  Possible Reasons for Event: unknown  Comments/MD notified: MD discontinued SSI    Molly Ashley C  Remember to initiate Hypoglycemia Order Set & complete

## 2014-11-27 NOTE — Evaluation (Signed)
Physical Therapy Evaluation Patient Details Name: Molly Ashley MRN: [id number] DOB: May 29, 1913 Today's Date: 11/27/2014   History of Present Illness  Patient is a 81 yo female admitted 11/26/14 with weakness, confusion, falls.  Patient with UTI.  PMH:  DM arthritis, ulcer Lt heel, HOH, dementia  Clinical Impression  Patient presents with problems listed below.  Will benefit from acute PT to maximize functional independence prior to return home with daughter.      Follow Up Recommendations Home health PT;Supervision/Assistance - 24 hour    Equipment Recommendations  None recommended by PT    Recommendations for Other Services       Precautions / Restrictions Precautions Precautions: Fall Restrictions Weight Bearing Restrictions: No      Mobility  Bed Mobility Overal bed mobility: Needs Assistance Bed Mobility: Rolling;Sidelying to Sit;Sit to Supine Rolling: Min assist Sidelying to sit: Mod assist   Sit to supine: Min assist   General bed mobility comments: Verbal cues for technique.  Assist to use UE on bed rail to assist with rolling.  Mod assist to raise trunk to sitting position.  Once upright, patient with good sitting balance.  Assist to bring LE's onto bed.  Transfers Overall transfer level: Needs assistance Equipment used: Rolling walker (2 wheeled) Transfers: Sit to/from Stand Sit to Stand: Min assist         General transfer comment: Verbal cues for hand placement.  Assist to steady during transfer to standing.  Ambulation/Gait Ambulation/Gait assistance: Min guard Ambulation Distance (Feet): 52 Feet Assistive device: Rolling walker (2 wheeled) Gait Pattern/deviations: Step-through pattern;Decreased stride length;Shuffle Gait velocity: Decreased   General Gait Details: Verbal cues for safe use of RW.  Patient able to maneuver RW on her own.  Slightly decreased balance with gait.  Stairs            Wheelchair Mobility    Modified Rankin  (Stroke Patients Only)       Balance Overall balance assessment: Needs assistance         Standing balance support: Bilateral upper extremity supported Standing balance-Leahy Scale: Poor                               Pertinent Vitals/Pain Pain Assessment: No/denies pain    Home Living Family/patient expects to be discharged to:: Private residence Living Arrangements: Children (Daughter) Available Help at Discharge: Family;Available 24 hours/day (Daughter 24 hours, niece at night, other relatives prn) Type of Home: House Home Access: Level entry     Home Layout: One level Home Equipment: Walker - 2 wheels;Bedside commode;Shower seat Additional Comments: Per granddaughter, patient is now living with patient's daughter who can provide 24 hour assist    Prior Function Level of Independence: Independent with assistive device(s);Needs assistance   Gait / Transfers Assistance Needed: Able to ambulate with RW with supervision in home.  ADL's / Homemaking Assistance Needed: Assist for bathing, meal prep, housekeeping        Hand Dominance   Dominant Hand: Right    Extremity/Trunk Assessment   Upper Extremity Assessment: Generalized weakness;RUE deficits/detail RUE Deficits / Details: Decreased shoulder strength/ROM - prior shoulder replacement per patient.         Lower Extremity Assessment: Generalized weakness (Patient reports toes are numb)         Communication   Communication: HOH  Cognition Arousal/Alertness: Awake/alert Behavior During Therapy: WFL for tasks assessed/performed;Anxious Overall Cognitive Status: History of cognitive impairments -  at baseline                      General Comments      Exercises        Assessment/Plan    PT Assessment Patient needs continued PT services  PT Diagnosis Abnormality of gait;Generalized weakness;Altered mental status   PT Problem List Decreased strength;Decreased activity  tolerance;Decreased mobility;Decreased balance;Decreased cognition;Decreased knowledge of use of DME  PT Treatment Interventions DME instruction;Gait training;Functional mobility training;Therapeutic activities;Balance training;Patient/family education;Cognitive remediation   PT Goals (Current goals can be found in the Care Plan section) Acute Rehab PT Goals Patient Stated Goal: To return home PT Goal Formulation: With patient/family Time For Goal Achievement: 12/04/14 Potential to Achieve Goals: Good    Frequency Min 3X/week   Barriers to discharge        Co-evaluation               End of Session Equipment Utilized During Treatment: Gait belt Activity Tolerance: Patient tolerated treatment well;Patient limited by fatigue Patient left: in bed;with call bell/phone within reach;with bed alarm set;with family/visitor present Nurse Communication: Mobility status         Time: 1204-1229 PT Time Calculation (min) (ACUTE ONLY): 25 min   Charges:   PT Evaluation $Initial PT Evaluation Tier I: 1 Procedure PT Treatments $Gait Training: 8-22 mins   PT G Codes:        [name] 12-07-14, 2:26 PM [name]. [name], [place] Pager [phone]

## 2014-11-27 NOTE — ED Notes (Signed)
Attempted report 

## 2014-11-27 NOTE — Progress Notes (Signed)
Subjective: Molly Ashley felt very well this morning. She said she urinated without pain and she has no pain anywhere. She said she no longer feels like she has a subjective fever. She said she is eating well with no issue. She confirmed that she is DNR.    Objective: Vital signs in last 24 hours: Filed Vitals:   11/27/14 0146 11/27/14 0211 11/27/14 0513 11/27/14 0516  BP: 128/31 137/50  125/39  Pulse: 61     Temp:  98 F (36.7 C)  98.2 F (36.8 C)  TempSrc:  Oral  Oral  Resp: 20 16  16   Height:   4\' 9"  (1.448 m)   Weight:   106 lb 7.7 oz (48.3 kg)   SpO2: 98% 97%  94%   Weight change:   Intake/Output Summary (Last 24 hours) at 11/27/14 1318 Last data filed at 11/27/14 0827  Gross per 24 hour  Intake    100 ml  Output    400 ml  Net   -300 ml   Gen: A&O x 4, No acute distress, well developed, well nourished HEENT: Atraumatic, PERRL, EOMI, sclerae anicteric, moist mucous membranes Heart: Regular rate and rhythm, normal S1 S2, no murmurs, rubs, or gallops Lungs: Clear to auscultation bilaterally, respirations unlabored Abd: Soft, non-tender including suprapubic, non-distended, + bowel sounds, no hepatosplenomegaly, no CVA tenderness Ext: L heel well bandaged (clean and dry)   Lab Results: Basic Metabolic Panel:  Recent Labs Lab 11/26/14 2053 11/27/14 0632  NA 136 138  K 3.1* 2.6*  CL 106 109  CO2 17* 18*  GLUCOSE 174* 49*  BUN 36* 36*  CREATININE 2.13* 2.19*  CALCIUM 8.3* 8.0*   Liver Function Tests:  Recent Labs Lab 11/27/14 0632  AST 45*  ALT 31  ALKPHOS 79  BILITOT 0.7  PROT 5.5*  ALBUMIN 2.2*   CBC:  Recent Labs Lab 11/26/14 2053 11/27/14 0632  WBC 22.6* 25.0*  NEUTROABS 20.1* 20.4*  HGB 8.9* 8.6*  HCT 27.3* 26.4*  MCV 92.5 92.6  PLT 261 285   CBG:  Recent Labs Lab 11/27/14 0748 11/27/14 0816 11/27/14 1108  GLUCAP 48* 89 96   Urinalysis:  Recent Labs Lab 11/26/14 2158  COLORURINE YELLOW  LABSPEC 1.015  PHURINE 5.5    GLUCOSEU 100*  HGBUR MODERATE*  BILIRUBINUR NEGATIVE  KETONESUR NEGATIVE  PROTEINUR >300*  UROBILINOGEN 1.0  NITRITE NEGATIVE  LEUKOCYTESUR SMALL*   Misc. Labs: lactic acid 0.8  Micro Results: No results found for this or any previous visit (from the past 240 hour(s)). Studies/Results: Dg Chest 2 View  11/27/2014   CLINICAL DATA:  Acute onset of shortness of breath and weakness. Initial encounter.  EXAM: CHEST  2 VIEW  COMPARISON:  Chest radiograph performed 07/19/2014  FINDINGS: The lungs are well-aerated. Mild vascular congestion is noted. Peribronchial thickening is seen. Mild left basilar atelectasis is noted. There is no evidence of pleural effusion or pneumothorax.  The heart is mildly enlarged. No acute osseous abnormalities are seen. There is marked chronic deformity involving the left humeral head. The patient's right shoulder arthroplasty is grossly unremarkable in appearance.  IMPRESSION: Mild vascular congestion and mild cardiomegaly noted. Peribronchial thickening seen. Mild left basilar atelectasis noted.   Electronically Signed   By: Garald Balding M.D.   On: 11/27/2014 01:25   Medications: I have reviewed the patient's current medications. Scheduled Meds: . heparin  5,000 Units Subcutaneous 3 times per day  . levofloxacin (LEVAQUIN) IV  500 mg Intravenous Q48H  .  sodium chloride  3 mL Intravenous Q12H   Continuous Infusions:   PRN Meds:.acetaminophen, polyvinyl alcohol Assessment/Plan: Principal Problem:   UTI (urinary tract infection) Active Problems:   Acute-on-chronic kidney injury   Anemia   Diabetes mellitus, type 2  #Uncomplicated UTI Patient presented with urinary retention along with generalized weakness and possible altered mental status with UA consistent with UTI, UCx pending, although rare bacteria after one dose abx. She feels her dysuria has resolved and is back to her baseline after receiving levaquin (cephalexin allergy). While he is afebrile, her  WBC has increased from to 22.6 on presentation to 25 this morning. Other cause of infection unlikely as CXR negative, BCx x 2 pending. Wound on L heel does not appear infectious on exam. -One more dose tomorrow of levaquin iv v phosphamycin (3 days total for uncomplicated UTI) -d/c Normal saline at 125 mL per hour as good po intake -Tylenol 650 mg every 6 hours as needed. -Follow-up urine and blood cultures. -PT/OT  #Acute on chronic kidney injury Most likely prerenal due to UTI. -Monitor creatinine -encourage po intake  #Hypokalemia K 3.1 on presentation, admitting MDs held on supplement given AKI but this morning 2.6. -KDur 40 mEq x 2 then recheck  #Anemia Hemoglobin close to baseline of 9-10. Anemia panel in December 2015 was consistent with anemia chronic disease. -Continue to monitor.  #Type 2 diabetes On glipizide 5 mg daily at bedtime at home. Last A1c 7.3 in December 2015. -Check hemoglobin A1c. -CBGs with sliding scale insulin before meals at bedtime  #Dementia On Aricept 5 mg daily at bedtime at home. Aricept can cause fatigue and weakness. -Hold Aricept for now.  #Hyperlipidemia -d/c simvastatin 10 mg daily as talk with patient and granddaughter  #Pressure ulcer No evidence of infection currently. -appreciate wound care.  #DVT prophylaxis -Heparin.  Dispo: Disposition is deferred at this time, awaiting improvement of current medical problems.  Anticipated discharge in approximately 1-2 day(s).   The patient does have a current PCP (Rafaela Arville Care, MD) and does need an [facility] hospital follow-up appointment after discharge.  The patient does not know have transportation limitations that hinder transportation to clinic appointments.  .Services Needed at time of discharge: Y = Yes, Blank = No PT:   OT:   RN:   Equipment:   Other:     LOS: 0 days   Kelby Aline, MD 11/27/2014, 1:18 PM

## 2014-11-27 NOTE — H&P (Signed)
Date: 11/27/2014               Patient Name:  Molly Ashley MRN: [id number]  DOB: [birth year] Age / Sex: 81 y.o., female   PCP: Robyne Peers, MD              Medical Service: Internal Medicine Teaching Service              Attending Physician: Dr. Sid Falcon, MD    First Contact: Dr. Ethelene Hal Pager: [phone]  Second Contact: Dr. Gordy Levan Pager: [phone]            After Hours (After 5p/  First Contact Pager: [phone]  weekends / holidays): Second Contact Pager: [phone]   Chief Complaint:  Weakness.  History of Present Illness: Molly Ashley is a 81 year old woman with history of type 2 diabetes and arthritis presenting with generalized weakness. The patient reports feeling well currently, but her family reports that she has been increasingly weak over the past couple of days. They also report that she has been more confused than normal. She has had a couple of falls while walking with her walker, but she has not hit her head or lost consciousness. The patient does report some difficulty urinating over the past couple of days with difficulty emptying her bladder, but she denies any dysuria. She also denies any fevers, chills, shortness of breath, cough, chest pain, diarrhea, constipation, photophobia, headache, or neck stiffness.  She went to see her primary care physician this morning was diagnosed with a urinary tract infection. She was started on oral antibiotics, and she took 1 dose prior to coming to the emergency room. She returned home, but she was told to come to the emergency room due to an elevated white blood cell count of 24,000. She reports a chronic red sore on her left heel that is been healing with no increased drainage or purulence.  In the ER, the patient was febrile to 101.3 and urinalysis was consistent with a urinary tract infection. She was given Tylenol 650 mg and started on levofloxacin IV.  Review of Systems: Review of Systems  Constitutional: Positive  for malaise/fatigue. Negative for fever, chills and weight loss.  HENT: Negative for congestion and sore throat.   Eyes: Negative for blurred vision and photophobia.  Respiratory: Negative for cough, hemoptysis, sputum production and wheezing.   Cardiovascular: Negative for chest pain, palpitations, orthopnea and leg swelling.  Gastrointestinal: Negative for nausea, vomiting, abdominal pain, diarrhea and constipation.  Genitourinary: Negative for dysuria and frequency.       Difficulty voiding.  Musculoskeletal: Negative for myalgias, back pain and joint pain.  Skin: Negative for rash.  Neurological: Positive for weakness. Negative for dizziness, sensory change, focal weakness and headaches.  Endo/Heme/Allergies: Bruises/bleeds easily (chronic).    Meds:  (Not in a hospital admission) Current Facility-Administered Medications  Medication Dose Route Frequency Provider Last Rate Last Dose  . 0.9 %  sodium chloride infusion   Intravenous Continuous Lacretia Leigh, MD 125 mL/hr at 11/26/14 2117    . acetaminophen (TYLENOL) tablet 650 mg  650 mg Oral Q6H PRN Francesca Oman, DO      . insulin aspart (novoLOG) injection 0-9 Units  0-9 Units Subcutaneous TID WC Francesca Oman, DO      . levofloxacin [facility]) IVPB 500 mg  500 mg Intravenous Q48H Rebecka Apley, [facility]   Stopped at 11/27/14 0108   Current Outpatient Prescriptions  Medication Sig Dispense Refill  .  acetaminophen (TYLENOL) 500 MG tablet Take 500 mg by mouth every 6 (six) hours as needed (for pain.).    Marland Kitchen donepezil (ARICEPT) 5 MG tablet Take 5 mg by mouth at bedtime.   6  . glipiZIDE (GLUCOTROL) 5 MG tablet Take 5 mg by mouth daily.     Vladimir Faster Glycol-Propyl Glycol (SYSTANE OP) Apply 1 drop to eye as needed (dry eyes).    . simvastatin (ZOCOR) 10 MG tablet Take 10 mg by mouth daily.    . propranolol (INDERAL) 40 MG tablet Take 1 tablet (40 mg total) by mouth 2 (two) times daily. (Patient not taking: Reported on 11/26/2014) 60 tablet 0     Allergies: Allergies as of 11/26/2014 - Review Complete 11/26/2014  Allergen Reaction Noted  . Cephalexin Other (See Comments) 06/30/2014   Past Medical History  Diagnosis Date  . Arthritis   . Cancer   . Diabetes mellitus without complication   . Cataract    Past Surgical History  Procedure Laterality Date  . Abdominal hysterectomy    . Eye surgery     No family history on file. History   Social History  . Marital Status: Widowed    Spouse Name: N/A  . Number of Children: N/A  . Years of Education: N/A   Occupational History  . Not on file.   Social History Main Topics  . Smoking status: Never Smoker   . Smokeless tobacco: Never Used  . Alcohol Use: No  . Drug Use: No  . Sexual Activity: Not on file   Other Topics Concern  . Not on file   Social History Narrative    Physical Exam: Filed Vitals:   11/26/14 2331  BP: 129/87  Pulse: 62  Temp: 98.6 F (37 C)  Resp: 20   Physical Exam  Constitutional: She is well-developed, well-nourished, and in no distress. No distress.  HENT:  Head: Normocephalic and atraumatic.  Mouth/Throat: No oropharyngeal exudate.  Decreased vision.  Eyes: Conjunctivae and EOM are normal. Pupils are equal, round, and reactive to light. No scleral icterus.  Neck: Normal range of motion. Neck supple.  Cardiovascular: Normal rate, regular rhythm and normal heart sounds.   Pulmonary/Chest: Effort normal and breath sounds normal. No respiratory distress.  Abdominal: Soft. Bowel sounds are normal. She exhibits no distension. There is no tenderness.  No CVA tenderness.  Musculoskeletal: Normal range of motion. She exhibits no edema or tenderness.  Neurological: She is alert. No cranial nerve deficit. She exhibits normal muscle tone.  Oriented to person, but not place or time-baseline.  Skin: Skin is warm and dry. She is not diaphoretic. No erythema.  Bruising throughout body. 0.5 x 0.75 cm superficial ulcer on left heel.   Lab  results: Basic Metabolic Panel:  Recent Labs  11/26/14 2053  NA 136  K 3.1*  CL 106  CO2 17*  GLUCOSE 174*  BUN 36*  CREATININE 2.13*  CALCIUM 8.3*   CBC:  Recent Labs  11/26/14 2053  WBC 22.6*  NEUTROABS 20.1*  HGB 8.9*  HCT 27.3*  MCV 92.5  PLT 261   Urinalysis:  Recent Labs  11/26/14 2158  COLORURINE YELLOW  LABSPEC 1.015  PHURINE 5.5  GLUCOSEU 100*  HGBUR MODERATE*  BILIRUBINUR NEGATIVE  KETONESUR NEGATIVE  PROTEINUR >300*  UROBILINOGEN 1.0  NITRITE NEGATIVE  LEUKOCYTESUR SMALL*  Rare squamous epithelial cells, too numerous to count WBCs, 3-6 RBCs, rare bacteria.  Lactic acid: 1.26.  Imaging results:  No results found.  Assessment &  Plan by Problem: Principal Problem:   UTI (urinary tract infection) Active Problems:   Acute-on-chronic kidney injury   Anemia   UTI (lower urinary tract infection)   #UTI Patient presented with urinary retention along with generalized weakness and possible altered mental status. She appears to have improved since being in the emergency room. Urinalysis consistent with UTI. Strange to only see rare bacteria, but she did receive 1 dose of antibiotics prior to coming to the ER. White blood cell count significant elevated, so will rule out other possible infections with chest x-ray. Lactic acid normal. -Admit to MedSurg. -Continue levofloxacin IV. -Normal saline at 125 mL per hour. -Tylenol 650 mg every 6 hours as needed. -Follow-up urine and blood cultures. -Check 2 view chest x-ray. -Regular diet. -Consult PT and OT.  #Acute on chronic kidney injury Most likely prerenal due to UTI. -Monitor creatinine.  #Hypokalemia In the setting of acute kidney injury, we'll hold off on supplementing for now. -Repeat BMP in the morning.  #Anemia Hemoglobin close to baseline of 9-10. Anemia panel in December 2015 was consistent with anemia chronic disease. -Continue to monitor.  #Type 2 diabetes On glipizide 5 mg daily  at bedtime at home. Last A1c 7.3 in December 2015. -Check hemoglobin A1c. -CBGs with sliding scale insulin before meals at bedtime  #Dementia On Aricept 5 mg daily at bedtime at home. Aricept can cause fatigue and weakness. -Hold Aricept for now. -Reassess tomorrow.  #Hyperlipidemia -Hold off on simvastatin 10 mg daily given generalized weakness and advanced age. -Consider stopping at discharge.  #Pressure ulcer No evidence of infection currently. -Consult to wound care.  #DVT prophylaxis -Heparin.  Dispo: Disposition is deferred at this time, awaiting improvement of current medical problems. Anticipated discharge in approximately 1-2 day(s).   The patient does have a current PCP (Rafaela Arville Care, MD), therefore will not be require OPC follow-up after discharge.   The patient does have transportation limitations that hinder transportation to clinic appointments.   Signed:  Arman Filter, MD, PhD PGY-1 Internal Medicine Teaching Service Pager: [phone] 11/27/2014, 1:22 AM

## 2014-11-27 NOTE — Consult Note (Signed)
WOC wound consult note Reason for Consult:Consult requested for left heel wound.  Wound type: Stage 2 Pressure Ulcer POA: Yes Measurement:4X4cm red blistered area which has ruptured and evolved into open area in center; .2X.2X.1cm, red and dry. Drainage (amount, consistency, odor) no odor or drainage Periwound: Skin intact where previous blister was located Dressing procedure/placement/frequency: Float heels to reduce pressure.  Foam dressing to protect and promote healing.  Discussed plan of care with patient and she verbalizes understanding. Please re-consult if further assistance is needed.  Thank-you,  Julien Girt MSN, [place], [place], [place], [place]

## 2014-11-27 NOTE — ED Notes (Signed)
Pt has 1 gold colored necklace with 2 charms, 1 multi-green colored pillow and 1 blue jacket with pt.

## 2014-11-27 NOTE — Progress Notes (Signed)
CRITICAL VALUE ALERT  Critical value received:  K+ 2.6  Date of notification:  11/27/14  Time of notification:  7673  Critical value read back:Yes.    Nurse who received alert:  Alphonzo Lemmings, RN  MD notified (1st page):  Dr. Ethelene Hal @ [phone]  Time of first page:  0802  MD notified (2nd page):  Time of second page:  Responding MD:  Dr. Ethelene Hal  Time MD responded:  [phone]

## 2014-11-27 NOTE — Progress Notes (Signed)
Hypoglycemic Event  CBG: 38  Treatment: 15 GM carbohydrate snack  Symptoms: some confusion  Follow-up CBG: Time: 2130 CBG Result: 75  Possible Reasons for Event: Inadequate meal intake  Comments/MD notified: Dr. Trudee Kuster notified    Erline Hau N  Remember to initiate Hypoglycemia Order Set & complete

## 2014-11-27 NOTE — Care Management Note (Addendum)
    Page 1 of 1   11/30/2014     1:33:08 PM CARE MANAGEMENT NOTE 11/30/2014  Patient:  Molly Ashley, Molly Ashley   Account Number:  [id number]  Date Initiated:  11/27/2014  Documentation initiated by:  Tomi Bamberger  Subjective/Objective Assessment:   dx uti,  admit- lives with granddaughter  Active with [facility] for wound care and catheter care.     Action/Plan:     pt eval- rec snf   Anticipated DC Date:  11/30/2014   Anticipated DC Plan:  SKILLED NURSING FACILITY  In-house referral  Clinical Social Worker      DC Planning Services  CM consult      Choice offered to / List presented to:             Status of service:  Completed, signed off Medicare Important Message given?  YES (If response is "NO", the following Medicare IM given date fields will be blank) Date Medicare IM given:  11/30/2014 Medicare IM given by:  Tomi Bamberger Date Additional Medicare IM given:  11/30/2014 Additional Medicare IM given by:  Tomi Bamberger  Discharge Disposition:  [place]  Per UR Regulation:  Reviewed for med. necessity/level of care/duration of stay  If discussed at [place] of Stay Meetings, dates discussed:    Comments:  11/30/14 [place], BSN [phone] [place] for dc today to snf..  11/27/14 [place], BSN [phone] patient lives with granddaughter, per physical therapy rec hhpt.  Patient states she has hh with an agency but can not remember who,  NCM called granddaughter and left message for her to return call to try and get hh services set up.

## 2014-11-27 NOTE — Progress Notes (Signed)
Pt arrived to floor in room 5W27. Settled patient in. Oriented to room and equipment. Fluids running. Sacral foam dressing placed for prophylaxis. Foam dressing replaced for stage II ulcer on left heel. Vitals stable. Will continue to monitor.

## 2014-11-28 ENCOUNTER — Inpatient Hospital Stay (HOSPITAL_COMMUNITY): Payer: Medicare HMO

## 2014-11-28 DIAGNOSIS — E11649 Type 2 diabetes mellitus with hypoglycemia without coma: Secondary | ICD-10-CM

## 2014-11-28 DIAGNOSIS — R0602 Shortness of breath: Secondary | ICD-10-CM | POA: Insufficient documentation

## 2014-11-28 DIAGNOSIS — N184 Chronic kidney disease, stage 4 (severe): Secondary | ICD-10-CM

## 2014-11-28 LAB — GLUCOSE, CAPILLARY
GLUCOSE-CAPILLARY: 107 mg/dL — AB (ref 70–99)
GLUCOSE-CAPILLARY: 65 mg/dL — AB (ref 70–99)
GLUCOSE-CAPILLARY: 66 mg/dL — AB (ref 70–99)
GLUCOSE-CAPILLARY: 67 mg/dL — AB (ref 70–99)
GLUCOSE-CAPILLARY: 81 mg/dL (ref 70–99)
GLUCOSE-CAPILLARY: 98 mg/dL (ref 70–99)
GLUCOSE-CAPILLARY: 98 mg/dL (ref 70–99)
Glucose-Capillary: 66 mg/dL — ABNORMAL LOW (ref 70–99)
Glucose-Capillary: 82 mg/dL (ref 70–99)
Glucose-Capillary: 49 mg/dL — ABNORMAL LOW (ref 70–99)
Glucose-Capillary: 92 mg/dL (ref 70–99)
Glucose-Capillary: 76 mg/dL (ref 70–99)
Glucose-Capillary: 119 mg/dL — ABNORMAL HIGH (ref 70–99)
Glucose-Capillary: 149 mg/dL — ABNORMAL HIGH (ref 70–99)
Glucose-Capillary: 124 mg/dL — ABNORMAL HIGH (ref 70–99)
Glucose-Capillary: 98 mg/dL (ref 70–99)

## 2014-11-28 LAB — BASIC METABOLIC PANEL
ANION GAP: 10 (ref 5–15)
BUN: 34 mg/dL — ABNORMAL HIGH (ref 6–23)
CALCIUM: 8.2 mg/dL — AB (ref 8.4–10.5)
CO2: 17 mmol/L — AB (ref 19–32)
Chloride: 111 mmol/L (ref 96–112)
Creatinine, Ser: 1.92 mg/dL — ABNORMAL HIGH (ref 0.50–1.10)
GFR calc Af Amer: 24 mL/min — ABNORMAL LOW (ref >90)
GFR calc non Af Amer: 20 mL/min — ABNORMAL LOW (ref >90)
GLUCOSE: 80 mg/dL (ref 70–99)
POTASSIUM: 4.8 mmol/L (ref 3.5–5.1)
SODIUM: 138 mmol/L (ref 135–145)

## 2014-11-28 LAB — URINE CULTURE
COLONY COUNT: NO GROWTH
Culture: NO GROWTH

## 2014-11-28 LAB — CBC
HCT: 28.6 % — ABNORMAL LOW (ref 36.0–46.0)
HEMOGLOBIN: 9.2 g/dL — AB (ref 12.0–15.0)
MCH: 30.2 pg (ref 26.0–34.0)
MCHC: 32.2 g/dL (ref 30.0–36.0)
MCV: 93.8 fL (ref 78.0–100.0)
Platelets: 310 10*3/uL (ref 150–400)
RBC: 3.05 MIL/uL — ABNORMAL LOW (ref 3.87–5.11)
RDW: 14.1 % (ref 11.5–15.5)
WBC: 18.1 10*3/uL — ABNORMAL HIGH (ref 4.0–10.5)

## 2014-11-28 LAB — HEMOGLOBIN A1C
HEMOGLOBIN A1C: 6.9 % — AB (ref 4.8–5.6)
Mean Plasma Glucose: 151 mg/dL

## 2014-11-28 MED ORDER — DEXTROSE-NACL 5-0.45 % IV SOLN: INTRAVENOUS | Status: AC

## 2014-11-28 MED ORDER — DEXTROSE 5 % IV SOLN: INTRAVENOUS | Status: DC

## 2014-11-28 MED ORDER — DONEPEZIL HCL 5 MG PO TABS
5.0000 mg | ORAL_TABLET | Freq: Every day | ORAL | Status: DC
  Administered 2014-11-28 – 2014-11-30 (×3): 5 mg via ORAL
  Filled 2014-11-28 (×4): qty 1

## 2014-11-28 MED ORDER — FOSFOMYCIN TROMETHAMINE 3 G PO PACK
3.0000 g | PACK | Freq: Once | ORAL | Status: AC
  Administered 2014-11-28: 3 g via ORAL
  Filled 2014-11-28: qty 3

## 2014-11-28 MED ORDER — FUROSEMIDE 10 MG/ML IJ SOLN
20.0000 mg | Freq: Once | INTRAMUSCULAR | Status: AC
  Administered 2014-11-28: 20 mg via INTRAVENOUS
  Filled 2014-11-28: qty 2

## 2014-11-28 MED ORDER — HALOPERIDOL LACTATE 5 MG/ML IJ SOLN
0.2500 mg | Freq: Four times a day (QID) | INTRAMUSCULAR | Status: DC | PRN
  Administered 2014-11-28: 0.25 mg via INTRAVENOUS
  Filled 2014-11-28: qty 1

## 2014-11-28 MED ORDER — DEXTROSE-NACL 5-0.45 % IV SOLN: INTRAVENOUS | Status: DC

## 2014-11-28 NOTE — Progress Notes (Signed)
Pt agitiated, trying to climb out of bed, yelling at RN and family and BP 170/100 and respiration 38. Dr. Sherrine Maples made aware. Awaiting for Dr. Sherrine Maples to come assess pt. Will continue to monitor pt.

## 2014-11-28 NOTE — Progress Notes (Signed)
Repeat CBG 92

## 2014-11-28 NOTE — Progress Notes (Signed)
Hypoglycemic Event  CBG: 49  Treatment: 8 Oz orange juice given  Symptoms: some confusion  Will follow up in 15 min. MD notified.  Possible Reasons for Event: Inadequate meal intake

## 2014-11-28 NOTE — Progress Notes (Signed)
  Date: 11/28/2014  Patient name: JAZZMA NEIDHARDT  Medical record number: [id number]  Date of birth: 04/13/1913   This patient's plan of care was discussed with the house staff. Please see Dr. Ronnald Ramp' note for complete details. I concur with his findings.  Ms. Gato is a little more confused this morning, but this could be related to her hypoglycemia noted overnight, agitation from dementia or from difficulty hearing and understanding what's going on around her.  She was much more oriented when family was in the room.  Team will discuss with family about her normal baseline.  She noted belly pain last night, but when I saw her, her abdomen was NT, ND.  Her WBC is trending down to 18.1 today which is a good sign.  She continues with some renal insufficiency, however, she is also a little SOB today and requiring oxygen.  CXR will be done.  Encourage PO intake.  Due to low blood sugar, sulfonylurea is discontinued and will not be continued at discharge.  For her UTI, she will get fosfamycin today.  Cultures are currently NGTD. Consider sitter for overnight.    Sid Falcon, MD 11/28/2014, 11:02 AM

## 2014-11-28 NOTE — Progress Notes (Signed)
CBG: 66  Treatment: 4 Oz orange juice given  Symptoms: no change from baseline  Follow-up CBG: Time: 0105CBG Result: 66  Possible Reasons for Event: Inadequate meal intake

## 2014-11-28 NOTE — Progress Notes (Signed)
PT Cancellation Note  Patient Details Name: Molly Ashley MRN: [id number] DOB: 1913-07-02   Cancelled Treatment:    Reason Eval/Treat Not Completed: Medical issues which prohibited therapy.  Patient with increased confusion and on O2.  Granddaughter requested we hold PT today.  Will return in am.   [name] 11/28/2014, 5:14 PM [name]. [name], [place] Pager [phone]

## 2014-11-28 NOTE — Progress Notes (Signed)
Patient IV access was lost and 2 attempts to reinsert a new one by nurse and IV team were unsuccessful due to patient's non cooperation. Physician notified.

## 2014-11-28 NOTE — Progress Notes (Signed)
CBG: 66  Treatment: 4 Oz orange juice given  Symptoms: none  Follow-up CBG: Time: 0211CBG Result: 82  Possible Reasons for Event: Inadequate meal intake

## 2014-11-28 NOTE — Progress Notes (Signed)
Subjective: Patient slightly confused this AM, denies pain. Significantly low CBG's this AM, improved w/ po intake. DOes admit to some hypoglycemic symptoms. Discussed w/ grand daughter, seems to be at her baseline. Once again confirmed that the patient is in fact DNR as this was not updated in the patient's chart.    Objective: Vital signs in last 24 hours: Filed Vitals:   11/27/14 1411 11/27/14 2112 11/28/14 0503 11/28/14 0517  BP: 137/31 159/43  103/90  Pulse: 91 79  95  Temp: 98.6 F (37 C) 99 F (37.2 C)  98.2 F (36.8 C)  TempSrc: Oral Oral  Oral  Resp: 18 16  16   Height:      Weight:   107 lb 9.4 oz (48.8 kg)   SpO2: 99% 98%  99%   Weight change: 2 lb 9.4 oz (1.172 kg)  Intake/Output Summary (Last 24 hours) at 11/28/14 1139 Last data filed at 11/28/14 0847  Gross per 24 hour  Intake    480 ml  Output    850 ml  Net   -370 ml   Physical Exam: General: Elderly female, alert, cooperative, NAD. Hard of hearing. HEENT: PERRL, EOMI. Moist mucus membranes. Neck: Full range of motion without pain, supple, no lymphadenopathy or carotid bruits Lungs: Clear to ascultation bilaterally, normal work of respiration, no wheezes, rales, rhonchi Heart: RRR, no murmurs, gallops, or rubs Abdomen: Soft, non-tender, non-distended, BS + Extremities: No cyanosis, clubbing, or edema Neurologic: Alert & oriented x1, cranial nerves generally intact, strength grossly intact, sensation intact to light touch   Lab Results: Basic Metabolic Panel:  Recent Labs Lab 11/27/14 1846 11/28/14 0631  NA 139 138  K 3.9 4.8  CL 112 111  CO2 18* 17*  GLUCOSE 78 80  BUN 37* 34*  CREATININE 1.98* 1.92*  CALCIUM 8.0* 8.2*   Liver Function Tests:  Recent Labs Lab 11/27/14 0632  AST 45*  ALT 31  ALKPHOS 79  BILITOT 0.7  PROT 5.5*  ALBUMIN 2.2*   CBC:  Recent Labs Lab 11/26/14 2053 11/27/14 0632 11/28/14 0631  WBC 22.6* 25.0* 18.1*  NEUTROABS 20.1* 20.4*  --   HGB 8.9* 8.6*  9.2*  HCT 27.3* 26.4* 28.6*  MCV 92.5 92.6 93.8  PLT 261 285 310   CBG:  Recent Labs Lab 11/28/14 0610 11/28/14 0712 11/28/14 0757 11/28/14 0846 11/28/14 1024 11/28/14 1108  GLUCAP 81 76 65* 67* 119* 149*   Urinalysis:  Recent Labs Lab 11/26/14 2158  COLORURINE YELLOW  LABSPEC 1.015  PHURINE 5.5  GLUCOSEU 100*  HGBUR MODERATE*  BILIRUBINUR NEGATIVE  KETONESUR NEGATIVE  PROTEINUR >300*  UROBILINOGEN 1.0  NITRITE NEGATIVE  LEUKOCYTESUR SMALL*    Micro Results: Recent Results (from the past 240 hour(s))  Culture, blood (routine x 2)     Status: None (Preliminary result)   Collection Time: 11/26/14  8:51 PM  Result Value Ref Range Status   Specimen Description BLOOD RIGHT ARM  Final   Special Requests BOTTLES DRAWN AEROBIC AND ANAEROBIC 5CC  Final   Culture   Final           BLOOD CULTURE RECEIVED NO GROWTH TO DATE CULTURE WILL BE HELD FOR 5 DAYS BEFORE ISSUING A FINAL NEGATIVE REPORT Performed at [facility]    Report Status PENDING  Incomplete  Culture, blood (routine x 2)     Status: None (Preliminary result)   Collection Time: 11/26/14  9:02 PM  Result Value Ref Range Status   Specimen  Description BLOOD LEFT WRIST  Final   Special Requests BOTTLES DRAWN AEROBIC AND ANAEROBIC 5CC  Final   Culture   Final           BLOOD CULTURE RECEIVED NO GROWTH TO DATE CULTURE WILL BE HELD FOR 5 DAYS BEFORE ISSUING A FINAL NEGATIVE REPORT Performed at [facility]    Report Status PENDING  Incomplete  Urine culture     Status: None   Collection Time: 11/26/14  9:58 PM  Result Value Ref Range Status   Specimen Description URINE, CATHETERIZED  Final   Special Requests NONE  Final   Colony Count NO GROWTH Performed at [facility]   Final   Culture NO GROWTH Performed at [facility]   Final   Report Status 11/28/2014 FINAL  Final   Studies/Results: Dg Chest 2 View  11/27/2014   CLINICAL DATA:  Acute onset of shortness of breath  and weakness. Initial encounter.  EXAM: CHEST  2 VIEW  COMPARISON:  Chest radiograph performed 07/19/2014  FINDINGS: The lungs are well-aerated. Mild vascular congestion is noted. Peribronchial thickening is seen. Mild left basilar atelectasis is noted. There is no evidence of pleural effusion or pneumothorax.  The heart is mildly enlarged. No acute osseous abnormalities are seen. There is marked chronic deformity involving the left humeral head. The patient's right shoulder arthroplasty is grossly unremarkable in appearance.  IMPRESSION: Mild vascular congestion and mild cardiomegaly noted. Peribronchial thickening seen. Mild left basilar atelectasis noted.   Electronically Signed   By: Garald Balding M.D.   On: 11/27/2014 01:25   Medications: I have reviewed the patient's current medications. Scheduled Meds: . fosfomycin  3 g Oral Once  . heparin  5,000 Units Subcutaneous 3 times per day  . sodium chloride  3 mL Intravenous Q12H   Continuous Infusions:   PRN Meds:.acetaminophen, polyvinyl alcohol   Assessment/Plan: 81 y/o F w/ PMHx of DM type II, admitted for UTI.   DM type II w/ Hypoglycemia: CBG of 49 this AM w/ some mild confusion. Improved w/ po intake. -Continue to hold sulfonurea. Do not feel this medication is necessary moving forward -Ensure adequate po intake -Consider IV team for IV access and D5 administration if unable to keep blood sugar elevated.  -CBG's q2h -Restart ISS if blood sugars increase  Uncomplicated UTI: Patient denies urinary symptoms at this time. Urine cultures negative to date.  -Discontinue Levaquin; given Fosfomycin 3 g po once  Acute on CKD stage IV: Baseline Cr ~1.5, 1.92 this AM, still slowly improving. Most likely prerenal in setting of UTI.  -Repeat BMP in AM -Encourage adequate po intake.   Hypokalemia: Resolved. K 4.8 this AM -BMP in AM  Anemia: Stable. -Monitor for signs of bleeding  Dementia: Stable.  -Hold Aricept for now  Pressure  ulcer: Stable. No evidence of infection currently. -Appreciate WOC  DVT/PE PPx: Heparin [place]  Dispo: Disposition is deferred at this time, awaiting improvement of current medical problems.  Anticipated discharge in approximately 1-2 day(s).   The patient does have a current PCP (Rafaela Arville Care, MD) and does need an [facility] hospital follow-up appointment after discharge.  The patient does not know have transportation limitations that hinder transportation to clinic appointments.  .Services Needed at time of discharge: Y = Yes, Blank = No PT:   OT:   RN:   Equipment:   Other:     LOS: 1 day   Corky Sox, MD 11/28/2014, 11:39 AM

## 2014-11-29 ENCOUNTER — Inpatient Hospital Stay (HOSPITAL_COMMUNITY): Payer: Medicare HMO

## 2014-11-29 DIAGNOSIS — R0682 Tachypnea, not elsewhere classified: Secondary | ICD-10-CM

## 2014-11-29 LAB — CBC
HEMATOCRIT: 29.8 % — AB (ref 36.0–46.0)
HEMOGLOBIN: 9.3 g/dL — AB (ref 12.0–15.0)
MCH: 29.9 pg (ref 26.0–34.0)
MCHC: 31.2 g/dL (ref 30.0–36.0)
MCV: 95.8 fL (ref 78.0–100.0)
PLATELETS: 318 10*3/uL (ref 150–400)
RBC: 3.11 MIL/uL — AB (ref 3.87–5.11)
RDW: 14.2 % (ref 11.5–15.5)
WBC: 14.6 10*3/uL — AB (ref 4.0–10.5)

## 2014-11-29 LAB — BASIC METABOLIC PANEL
Anion gap: 10 (ref 5–15)
BUN: 28 mg/dL — AB (ref 6–20)
CALCIUM: 8.3 mg/dL — AB (ref 8.9–10.3)
CO2: 19 mmol/L — ABNORMAL LOW (ref 22–32)
Chloride: 113 mmol/L — ABNORMAL HIGH (ref 101–111)
Creatinine, Ser: 1.79 mg/dL — ABNORMAL HIGH (ref 0.44–1.00)
GFR calc Af Amer: 26 mL/min — ABNORMAL LOW (ref >60)
GFR, EST NON AFRICAN AMERICAN: 22 mL/min — AB (ref >60)
GLUCOSE: 203 mg/dL — AB (ref 70–99)
POTASSIUM: 4.5 mmol/L (ref 3.5–5.1)
Sodium: 142 mmol/L (ref 135–145)

## 2014-11-29 LAB — GLUCOSE, CAPILLARY
GLUCOSE-CAPILLARY: 121 mg/dL — AB (ref 70–99)
GLUCOSE-CAPILLARY: 123 mg/dL — AB (ref 70–99)
GLUCOSE-CAPILLARY: 181 mg/dL — AB (ref 70–99)
GLUCOSE-CAPILLARY: 258 mg/dL — AB (ref 70–99)
Glucose-Capillary: 134 mg/dL — ABNORMAL HIGH (ref 70–99)
Glucose-Capillary: 141 mg/dL — ABNORMAL HIGH (ref 70–99)
Glucose-Capillary: 176 mg/dL — ABNORMAL HIGH (ref 70–99)
Glucose-Capillary: 185 mg/dL — ABNORMAL HIGH (ref 70–99)

## 2014-11-29 MED ORDER — DOXYCYCLINE HYCLATE 100 MG PO TABS
100.0000 mg | ORAL_TABLET | Freq: Two times a day (BID) | ORAL | Status: DC
  Administered 2014-11-29 – 2014-12-01 (×5): 100 mg via ORAL
  Filled 2014-11-29 (×6): qty 1

## 2014-11-29 MED ORDER — FUROSEMIDE 10 MG/ML IJ SOLN
20.0000 mg | Freq: Once | INTRAMUSCULAR | Status: AC
  Administered 2014-11-29: 20 mg via INTRAVENOUS
  Filled 2014-11-29: qty 2

## 2014-11-29 NOTE — Significant Event (Signed)
Event Note:   Called by RN to evaluate patient's agitation ~9:00PM and confusion.  S: Patient yelling during entire interview "Get out of my home! I just want to be able to get to my doctor tomorrow! You don't know anything! You're on their side! Stop talking to me!" Grand-daughters and RN in room attempting to calm the patient. Report that this has been building for the last 4 hours.  O: Patient combative during interview, slapping away stethoscope, trying to get legs over the side of the bed on multiple occasions, refusing to rest. Visibly confused.  A/P: Patient is very confused and combative on exam; she appears to have delirium, as noted earlier in the day, along with a component of sundowning.   - Giving 0.25 mg haldol - Grand-daughters to stay with patient overnight; will help to redirect - Encouraging redirecting, turned off TV, encouraged grand-daughters to remain calm, encourage rest - Will consider soft restraints if patient continues to be at risk of self harm  Venita Lick, PGY1

## 2014-11-29 NOTE — Evaluation (Signed)
Occupational Therapy Evaluation Patient Details Name: Molly Ashley MRN: [id number] DOB: Dec 02, 1912 Today's Date: 11/29/2014    History of Present Illness Patient is a 81 yo female admitted 11/26/14 with weakness, confusion, falls.  Patient with UTI.  PMH:  DM arthritis, ulcer Lt heel, HOH, dementia   Clinical Impression   Pt was assisted for bathing and ambulated with a walker prior to admission. She could dress, groom and toilet with supervision.  Pt with increased confusion and very agitated last night requiring medicine.  Slept most of today, awakened for assessment.  Pt with tangential conversation, not at her baseline.  Granddaughter is at bedside and stating the family cannot manage the pt at the level she is currently functioning. Recommending SNF for ST rehab.      Follow Up Recommendations  SNF;Supervision/Assistance - 24 hour    Equipment Recommendations       Recommendations for Other Services       Precautions / Restrictions Precautions Precautions: Fall Restrictions Weight Bearing Restrictions: No      Mobility Bed Mobility   Bed Mobility: Rolling;Sidelying to Sit;Sit to Supine Rolling: Min assist Sidelying to sit: Mod assist   Sit to supine: Mod assist   General bed mobility comments: assist to guide LEs and raise trunk with side to sit, assist to guide trunk and assist with LEs up in bed  Transfers                 General transfer comment: pt refused    Balance Overall balance assessment: Needs assistance   Sitting balance-Leahy Scale: Fair                                      ADL Overall ADL's : Needs assistance/impaired Eating/Feeding: Set up;Sitting   Grooming: Wash/dry hands;Wash/dry face;Minimal assistance;Sitting   Upper Body Bathing: Moderate assistance;Sitting   Lower Body Bathing: Maximal assistance;Sit to/from stand   Upper Body Dressing : Moderate assistance;Sitting   Lower Body Dressing: Maximal  assistance;Sit to/from stand                 General ADL Comments: Difficult to awaking pt for evaluation. Pt declining OOB, agreeable to EOB with help.     Vision     Perception     Praxis      Pertinent Vitals/Pain Pain Assessment: Faces Faces Pain Scale: Hurts even more Pain Location: R shoulder Pain Descriptors / Indicators: Sore Pain Intervention(s): Limited activity within patient's tolerance;Monitored during session;Repositioned     Hand Dominance Right   Extremity/Trunk Assessment Upper Extremity Assessment Upper Extremity Assessment: RUE deficits/detail;LUE deficits/detail RUE Deficits / Details: no functional use of R shoulder, reports a worn out shoulder replacement RUE: Unable to fully assess due to pain RUE Coordination: decreased gross motor LUE Deficits / Details: fragile skin, bruising   Lower Extremity Assessment Lower Extremity Assessment: Defer to PT evaluation       Communication Communication Communication: HOH   Cognition Arousal/Alertness: Awake/alert Behavior During Therapy: WFL for tasks assessed/performed Overall Cognitive Status: Impaired/Different from baseline Area of Impairment: Orientation;Memory;Following commands;Safety/judgement;Problem solving Orientation Level: Place;Time;Situation   Memory: Decreased short-term memory Following Commands: Follows one step commands with increased time (multimodal cues) Safety/Judgement: Decreased awareness of safety;Decreased awareness of deficits   Problem Solving: Slow processing;Decreased initiation;Difficulty sequencing;Requires verbal cues;Requires tactile cues General Comments: Pt with tangential conversation initially, became somewhat more lucid as conversation continued.  General Comments       Exercises       Shoulder Instructions      Home Living Family/patient expects to be discharged to:: Private residence Living Arrangements: Children Available Help at Discharge:  Family;Available 24 hours/day Type of Home: House Home Access: Level entry     Home Layout: One level               Home Equipment: Walker - 2 wheels;Bedside commode;Shower seat   Additional Comments: Per granddaughter, patient is now living with patient's daughter who can provide 24 hour assist but is oxygen dependent.      Prior Functioning/Environment Level of Independence: Independent with assistive device(s);Needs assistance  Gait / Transfers Assistance Needed: Able to ambulate with RW with supervision in home. ADL's / Homemaking Assistance Needed: Assist for bathing, meal prep, housekeeping        OT Diagnosis: Generalized weakness;Cognitive deficits;Acute pain   OT Problem List: Decreased strength;Decreased activity tolerance;Impaired balance (sitting and/or standing);Impaired vision/perception;Decreased coordination;Impaired UE functional use;Pain;Decreased knowledge of use of DME or AE;Decreased cognition;Decreased safety awareness   OT Treatment/Interventions: Self-care/ADL training;DME and/or AE instruction;Patient/family education;Balance training    OT Goals(Current goals can be found in the care plan section) Acute Rehab OT Goals Patient Stated Goal: no more needles OT Goal Formulation: With patient/family Time For Goal Achievement: 12/06/14 Potential to Achieve Goals: Good ADL Goals Pt Will Perform Grooming: with supervision;standing Pt Will Perform Upper Body Dressing: with supervision;sitting Pt Will Perform Lower Body Dressing: with supervision;sit to/from stand Pt Will Transfer to Toilet: with supervision;ambulating;regular height toilet Pt Will Perform Toileting - Clothing Manipulation and hygiene: with supervision;sit to/from stand Additional ADL Goal #1: Pt will perform bed mobility with supervision in preparation for ADL.  OT Frequency: Min 2X/week   Barriers to D/C:            Co-evaluation              End of Session Nurse  Communication:  (family cannot manage pt at home, will need snf rehab)  Activity Tolerance: Patient limited by fatigue Patient left: in bed;with family/visitor present;with nursing/sitter in room   Time: 1217-1242 OT Time Calculation (min): 25 min Charges:  OT General Charges $OT Visit: 1 Procedure OT Evaluation $Initial OT Evaluation Tier I: 1 Procedure OT Treatments $Self Care/Home Management : 8-22 mins G-Codes:    Malka So 11/29/2014, 1:03 PM [phone]

## 2014-11-29 NOTE — Progress Notes (Signed)
Attempted to recheck pt vital signs, pt became very agitated and combative, will try again later. Will continue monitor pt.

## 2014-11-29 NOTE — Progress Notes (Signed)
Subjective: Resting comfortably this AM, no acute distress. Quite agitated overnight, confused, required 0.25 mg Haldol. Improved today. No acute complaints, still mild tachypnea, 92% on room air. CBG's improved.    Objective: Vital signs in last 24 hours: Filed Vitals:   11/28/14 1339 11/28/14 2113 11/28/14 2114 11/29/14 0433  BP: 164/71 170/100  92/68  Pulse: 95 97  81  Temp: 98.2 F (36.8 C)  99 F (37.2 C) 97.7 F (36.5 C)  TempSrc: Oral  Oral Oral  Resp: 18 38  28  Height:      Weight:    100 lb 14.4 oz (45.768 kg)  SpO2: 100% 99%  100%   Weight change: -6 lb 11 oz (-3.032 kg)  Intake/Output Summary (Last 24 hours) at 11/29/14 1042 Last data filed at 11/29/14 0250  Gross per 24 hour  Intake      0 ml  Output    650 ml  Net   -650 ml   Physical Exam: General: Elderly female, alert, cooperative, NAD. Hard of hearing. HEENT: PERRL, EOMI. Moist mucus membranes. Neck: Full range of motion without pain, supple, no lymphadenopathy or carotid bruits Lungs: Air entry equal bilaterally. Mild scattered rales. No wheezes. Mild tachypnea.  Heart: RRR, no murmurs, gallops, or rubs Abdomen: Soft, non-tender, non-distended, BS + Extremities: No cyanosis, clubbing, or edema Neurologic: Alert & oriented x1, cranial nerves generally intact, strength grossly intact, sensation intact to light touch   Lab Results: Basic Metabolic Panel:  Recent Labs Lab 11/27/14 1846 11/28/14 0631  NA 139 138  K 3.9 4.8  CL 112 111  CO2 18* 17*  GLUCOSE 78 80  BUN 37* 34*  CREATININE 1.98* 1.92*  CALCIUM 8.0* 8.2*   Liver Function Tests:  Recent Labs Lab 11/27/14 0632  AST 45*  ALT 31  ALKPHOS 79  BILITOT 0.7  PROT 5.5*  ALBUMIN 2.2*   CBC:  Recent Labs Lab 11/26/14 2053 11/27/14 0632 11/28/14 0631  WBC 22.6* 25.0* 18.1*  NEUTROABS 20.1* 20.4*  --   HGB 8.9* 8.6* 9.2*  HCT 27.3* 26.4* 28.6*  MCV 92.5 92.6 93.8  PLT 261 285 310   CBG:  Recent Labs Lab  11/28/14 1944 11/28/14 2237 11/29/14 0011 11/29/14 0250 11/29/14 0425 11/29/14 0805  GLUCAP 98 107* 141* 121* 134* 123*   Urinalysis:  Recent Labs Lab 11/26/14 2158  COLORURINE YELLOW  LABSPEC 1.015  PHURINE 5.5  GLUCOSEU 100*  HGBUR MODERATE*  BILIRUBINUR NEGATIVE  KETONESUR NEGATIVE  PROTEINUR >300*  UROBILINOGEN 1.0  NITRITE NEGATIVE  LEUKOCYTESUR SMALL*    Micro Results: Recent Results (from the past 240 hour(s))  Culture, blood (routine x 2)     Status: None (Preliminary result)   Collection Time: 11/26/14  8:51 PM  Result Value Ref Range Status   Specimen Description BLOOD RIGHT ARM  Final   Special Requests BOTTLES DRAWN AEROBIC AND ANAEROBIC 5CC  Final   Culture   Final           BLOOD CULTURE RECEIVED NO GROWTH TO DATE CULTURE WILL BE HELD FOR 5 DAYS BEFORE ISSUING A FINAL NEGATIVE REPORT Performed at [facility]    Report Status PENDING  Incomplete  Culture, blood (routine x 2)     Status: None (Preliminary result)   Collection Time: 11/26/14  9:02 PM  Result Value Ref Range Status   Specimen Description BLOOD LEFT WRIST  Final   Special Requests BOTTLES DRAWN AEROBIC AND ANAEROBIC 5CC  Final  Culture   Final           BLOOD CULTURE RECEIVED NO GROWTH TO DATE CULTURE WILL BE HELD FOR 5 DAYS BEFORE ISSUING A FINAL NEGATIVE REPORT Performed at [facility]    Report Status PENDING  Incomplete  Urine culture     Status: None   Collection Time: 11/26/14  9:58 PM  Result Value Ref Range Status   Specimen Description URINE, CATHETERIZED  Final   Special Requests NONE  Final   Colony Count NO GROWTH Performed at [facility]   Final   Culture NO GROWTH Performed at [facility]   Final   Report Status 11/28/2014 FINAL  Final   Studies/Results: Dg Chest Port 1 View  11/29/2014   CLINICAL DATA:  Urinary tract infection. Chest congestion. History of diabetes.  EXAM: PORTABLE CHEST - 1 VIEW  COMPARISON:  11/28/2014   FINDINGS: The heart is enlarged. There is prominence of interstitial markings consistent with interstitial edema. There are small bilateral pleural effusions. There are no scratched a there is opacity at the medial left lung base obscuring the hemidiaphragm and suspicious for infectious infiltrate.  Previous right shoulder arthroplasty.  IMPRESSION: 1. Cardiomegaly and interstitial edema. 2. Left lower lobe infiltrate. 3. Bilateral pleural effusions.   Electronically Signed   By: Nolon Nations M.D.   On: 11/29/2014 10:38   Dg Chest Port 1 View  11/28/2014   CLINICAL DATA:  Shortness of breath.  History of diabetes.  EXAM: PORTABLE CHEST - 1 VIEW  COMPARISON:  One day prior  FINDINGS: Right shoulder arthroplasty. Midline trachea. Cardiomegaly accentuated by AP portable technique. Bilateral costophrenic angle blunting likely represents trace pleural fluid or thickening. No pneumothorax. Pulmonary interstitial thickening is felt to be slightly increased. Scarring at the left lung base.  IMPRESSION: Cardiomegaly with pulmonary interstitial thickening, felt to be slightly increased. Suspect developing pulmonary venous congestion with trace bilateral pleural effusions.   Electronically Signed   By: [name]   On: 11/28/2014 19:03   Medications: I have reviewed the patient's current medications. Scheduled Meds: . donepezil  5 mg Oral QHS  . heparin  5,000 Units Subcutaneous 3 times per day  . sodium chloride  3 mL Intravenous Q12H   Continuous Infusions:   PRN Meds:.acetaminophen, haloperidol lactate, polyvinyl alcohol   Assessment/Plan: 81 y/o F w/ PMHx of DM type II, admitted for UTI.   Delirium w/ Underlying Dementia: Confusion w/ agitation yesterday and last night. Most likely 2/2 in-hospital delirium in setting of acute illness and hypoglycemia yesterday. Required 0.25 mg Haldol overnight. Improved this AM.  -Continue to monitor  -Bedside sitter -Continue Aricept  Tachypnea:  Requiring 2L O2 at rest. CXR yesterday w/ pulmonary congestion, most likely to IVF administration initially. Given Lasix 20 mg IV yesterday, CXR somewhat improved this AM, however somewhat suggestive of LLL infiltrate.  -Start Doxycycline 100 mg bid for 5 days -Give Lasix 20 mg IV once today -Supplemental O2 prn  DM type II w/ Hypoglycemia: Resolved.  -Ensure adequate po intake -CBG's AC/HS -Restart ISS if blood sugars increase  Uncomplicated UTI: Patient denies urinary symptoms at this time. Given Fosfomycin 3 g po once yesterday.  -Continue to monitor for symptoms.   Acute on CKD stage IV: Baseline Cr ~1.5.  -Repeat BMP pending -Encourage adequate po intake.   Anemia: Stable. -Monitor for signs of bleeding  Pressure ulcer: Stable. No evidence of infection currently. -Appreciate WOC  DVT/PE PPx: Heparin [place]  Dispo: Disposition is deferred at this time, awaiting improvement of current medical problems.  Anticipated discharge tomorrow.   The patient does have a current PCP (Rafaela Arville Care, MD) and does need an [facility] hospital follow-up appointment after discharge.  The patient does not know have transportation limitations that hinder transportation to clinic appointments.  .Services Needed at time of discharge: Y = Yes, Blank = No PT:   OT:   RN:   Equipment:   Other:     LOS: 2 days   Corky Sox, MD 11/29/2014, 10:42 AM

## 2014-11-29 NOTE — Progress Notes (Addendum)
250 Attempted to recheck pt vital signs. Family requested to wait until the AM d/t patient resting. Will continue to monitor pt.

## 2014-11-29 NOTE — Progress Notes (Signed)
  Date: 11/29/2014  Patient name: Molly Ashley  Medical record number: [id number]  Date of birth: 03/09/1913   This patient's plan of care was discussed with the house staff. Please see Dr. Ronnald Ramp' note for complete details. I concur with his findings.   Sid Falcon, MD 11/29/2014, 12:23 PM

## 2014-11-29 NOTE — Progress Notes (Signed)
Physical Therapy Treatment Patient Details Name: Molly Ashley MRN: [id number] DOB: November 05, 1912 Today's Date: 11/29/2014    History of Present Illness Patient is a 81 yo female admitted 11/26/14 with weakness, confusion, falls.  Patient with UTI.  PMH:  DM arthritis, ulcer Lt heel, HOH, dementia    PT Comments    Patient with decline in cognition over past 2 days with confusion and agitation.  More alert today, but not at her baseline.  Per granddaughter, family cannot manage patient at current cognitive/functional level.  Recommend SNF for continued therapy at discharge.   Follow Up Recommendations  SNF;Supervision/Assistance - 24 hour     Equipment Recommendations  None recommended by PT    Recommendations for Other Services       Precautions / Restrictions Precautions Precautions: Fall Restrictions Weight Bearing Restrictions: No    Mobility  Bed Mobility Overal bed mobility: Needs Assistance Bed Mobility: Rolling;Sidelying to Sit;Sit to Supine Rolling: Min assist Sidelying to sit: Mod assist   Sit to supine: Mod assist   General bed mobility comments: Assist to raise trunk to sitting position.  Once upright, patient with good balance.  Patient sat EOB x7 minutes.  Then stated "since I'm up, why don't I try the bathroom".  On return to bed, assist to bring LE's on to bed.  Transfers Overall transfer level: Needs assistance Equipment used: None Transfers: Sit to/from [facility] Sit to Stand: Min assist Stand pivot transfers: Min assist       General transfer comment: Verbal cues for hand placement.  Assist to steady to rise to standing.  Patient able to take several steps to pivot to [facility] with min assist.  Patient stood for pericare, and stepped to pivot back to bed.  Min assist for balance/safety.  Patient with dyspnea 3/4 following transfers.  Ambulation/Gait             General Gait Details: Declined   Stairs             Wheelchair Mobility    Modified Rankin (Stroke Patients Only)       Balance                                    Cognition Arousal/Alertness: Awake/alert Behavior During Therapy: WFL for tasks assessed/performed Overall Cognitive Status: Impaired/Different from baseline Area of Impairment: Orientation;Memory;Following commands;Safety/judgement;Problem solving Orientation Level: Disoriented to;Place;Time;Situation   Memory: Decreased short-term memory Following Commands: Follows one step commands with increased time Safety/Judgement: Decreased awareness of safety;Decreased awareness of deficits   Problem Solving: Slow processing;Decreased initiation;Difficulty sequencing;Requires verbal cues;Requires tactile cues General Comments: Molly Ashley became more appropriate with time.    Exercises      General Comments        Pertinent Vitals/Pain Pain Assessment: No/denies pain    Home Living                      Prior Function            PT Goals (current goals can now be found in the care plan section) Progress towards PT goals: Not progressing toward goals - comment (Patient with decline in cognitive status past 2 days)    Frequency  Min 2X/week    PT Plan Discharge plan needs to be updated;Frequency needs to be updated    Co-evaluation  End of Session Equipment Utilized During Treatment: Gait belt Activity Tolerance: Patient tolerated treatment well;Patient limited by fatigue Patient left: in bed;with call bell/phone within reach;with bed alarm set;with family/visitor present     Time: 8719-5974 PT Time Calculation (min) (ACUTE ONLY): 20 min  Charges:  $Therapeutic Activity: 8-22 mins                    G Codes:      Molly Ashley 2014/12/07, 6:18 PM Molly Ashley. Molly Ashley, [name] Pager [phone]

## 2014-11-29 NOTE — Progress Notes (Signed)
Made Dr. Sherrine Maples aware of pt respiration 28, no complaints of Shortness of breath and pt mews firing. Will continue to monitor pt.

## 2014-11-30 LAB — GLUCOSE, CAPILLARY
Glucose-Capillary: 195 mg/dL — ABNORMAL HIGH (ref 70–99)
Glucose-Capillary: 232 mg/dL — ABNORMAL HIGH (ref 70–99)
Glucose-Capillary: 302 mg/dL — ABNORMAL HIGH (ref 70–99)

## 2014-11-30 LAB — CBC
HEMATOCRIT: 29.4 % — AB (ref 36.0–46.0)
Hemoglobin: 9.3 g/dL — ABNORMAL LOW (ref 12.0–15.0)
MCH: 30.1 pg (ref 26.0–34.0)
MCHC: 31.6 g/dL (ref 30.0–36.0)
MCV: 95.1 fL (ref 78.0–100.0)
Platelets: 328 10*3/uL (ref 150–400)
RBC: 3.09 MIL/uL — ABNORMAL LOW (ref 3.87–5.11)
RDW: 14.3 % (ref 11.5–15.5)
WBC: 12.9 10*3/uL — AB (ref 4.0–10.5)

## 2014-11-30 LAB — BASIC METABOLIC PANEL
ANION GAP: 12 (ref 5–15)
BUN: 28 mg/dL — ABNORMAL HIGH (ref 6–20)
CALCIUM: 8.2 mg/dL — AB (ref 8.9–10.3)
CHLORIDE: 108 mmol/L (ref 101–111)
CO2: 21 mmol/L — ABNORMAL LOW (ref 22–32)
CREATININE: 1.62 mg/dL — AB (ref 0.44–1.00)
GFR calc non Af Amer: 25 mL/min — ABNORMAL LOW (ref >60)
GFR, EST AFRICAN AMERICAN: 29 mL/min — AB (ref >60)
GLUCOSE: 203 mg/dL — AB (ref 70–99)
POTASSIUM: 3.8 mmol/L (ref 3.5–5.1)
SODIUM: 141 mmol/L (ref 135–145)

## 2014-11-30 NOTE — Discharge Summary (Signed)
Name: Molly Ashley MRN: [id number] DOB: [birth year] 81 y.o. PCP: Molly Peers, MD  Date of Admission: 11/26/2014  7:55 PM Date of Discharge: 12/01/2014 Attending Physician: Molly Falcon, MD  Discharge Diagnosis:  Principal Problem:   UTI (urinary tract infection) Active Problems:   Acute-on-chronic kidney injury   Anemia   Diabetes mellitus, type 2   Generalized weakness   Leukocytosis   Shortness of breath  Discharge Medications:   Medication List    STOP taking these medications        glipiZIDE 5 MG tablet  Commonly known as:  GLUCOTROL     propranolol 40 MG tablet  Commonly known as:  INDERAL     simvastatin 10 MG tablet  Commonly known as:  ZOCOR      TAKE these medications        acetaminophen 500 MG tablet  Commonly known as:  TYLENOL  Take 500 mg by mouth every 6 (six) hours as needed (for pain.).     donepezil 5 MG tablet  Commonly known as:  ARICEPT  Take 5 mg by mouth at bedtime.     doxycycline 100 MG tablet  Commonly known as:  VIBRA-TABS  Take 1 tablet (100 mg total) by mouth every 12 (twelve) hours. Starting 5/3 pm     sitaGLIPtin 50 MG tablet  Commonly known as:  JANUVIA  Take 1 tablet (50 mg total) by mouth daily.     SYSTANE OP  Apply 1 drop to eye as needed (dry eyes).        Disposition and follow-up:   Molly.Molly Ashley was discharged from [facility] in [place] condition.  At the hospital follow up visit please address:  1.  Consider glycemic control, reassess medication regimen  2.  Labs / imaging needed at time of follow-up: CBC, BMP  3.  Pending labs/ test needing follow-up: none  Follow-up Appointments: Follow-up Information    Follow up with Molly Ashley., MD On 12/07/2014.   Specialty:  Family Medicine   Why:  @ 10 am   Contact information:   [place] [address] [phone]      Procedures Performed:  Dg Chest 2 View  11/27/2014   CLINICAL DATA:  Acute onset  of shortness of breath and weakness. Initial encounter.  EXAM: CHEST  2 VIEW  COMPARISON:  Chest radiograph performed 07/19/2014  FINDINGS: The lungs are well-aerated. Mild vascular congestion is noted. Peribronchial thickening is seen. Mild left basilar atelectasis is noted. There is no evidence of pleural effusion or pneumothorax.  The heart is mildly enlarged. No acute osseous abnormalities are seen. There is marked chronic deformity involving the left humeral head. The patient's right shoulder arthroplasty is grossly unremarkable in appearance.  IMPRESSION: Mild vascular congestion and mild cardiomegaly noted. Peribronchial thickening seen. Mild left basilar atelectasis noted.   Electronically Signed   By: Molly Ashley M.D.   On: 11/27/2014 01:25   Dg Chest Port 1 View  11/29/2014   CLINICAL DATA:  Urinary tract infection. Chest congestion. History of diabetes.  EXAM: PORTABLE CHEST - 1 VIEW  COMPARISON:  11/28/2014  FINDINGS: The heart is enlarged. There is prominence of interstitial markings consistent with interstitial edema. There are small bilateral pleural effusions. There are no scratched a there is opacity at the medial left lung base obscuring the hemidiaphragm and suspicious for infectious infiltrate.  Previous right shoulder arthroplasty.  IMPRESSION: 1. Cardiomegaly and interstitial  edema. 2. Left lower lobe infiltrate. 3. Bilateral pleural effusions.   Electronically Signed   By: Molly Ashley M.D.   On: 11/29/2014 10:38   Dg Chest Port 1 View  11/28/2014   CLINICAL DATA:  Shortness of breath.  History of diabetes.  EXAM: PORTABLE CHEST - 1 VIEW  COMPARISON:  One day prior  FINDINGS: Right shoulder arthroplasty. Midline trachea. Cardiomegaly accentuated by AP portable technique. Bilateral costophrenic angle blunting likely represents trace pleural fluid or thickening. No pneumothorax. Pulmonary interstitial thickening is felt to be slightly increased. Scarring at the left lung base.   IMPRESSION: Cardiomegaly with pulmonary interstitial thickening, felt to be slightly increased. Suspect developing pulmonary venous congestion with trace bilateral pleural effusions.   Electronically Signed   By: Molly Ashley M.D.   On: 11/28/2014 19:03   Admission HPI: Molly Ashley is a 81 year old woman with history of type 2 diabetes and arthritis presenting with generalized weakness. The patient reports feeling well currently, but her family reports that she has been increasingly weak over the past couple of days. They also report that she has been more confused than normal. She has had a couple of falls while walking with her walker, but she has not hit her head or lost consciousness. The patient does report some difficulty urinating over the past couple of days with difficulty emptying her bladder, but she denies any dysuria. She also denies any fevers, chills, shortness of breath, cough, chest pain, diarrhea, constipation, photophobia, headache, or neck stiffness. She went to see her primary care physician this morning was diagnosed with a urinary tract infection. She was started on oral antibiotics, and she took 1 dose prior to coming to the emergency room. She returned home, but she was told to come to the emergency room due to an elevated white blood cell count of 24,000. She reports a chronic red sore on her left heel that is been healing with no increased drainage or purulence.  In the ER, the patient was febrile to 101.3 and urinalysis was consistent with a urinary tract infection. She was given Tylenol 650 mg and started on levofloxacin IV.  Hospital Course by problem list: Principal Problem:   UTI (urinary tract infection) Active Problems:   Acute-on-chronic kidney injury   Anemia   Diabetes mellitus, type 2   Generalized weakness   Leukocytosis   Shortness of breath   Uncomplicated UTI: Molly Molly Ashley presented with urinary retention along with generalized weakness and possible  altered mental status. In the ED, she received one dose of iv levaquin (allergy to cephalexin). Urinalysis consistent with UTI although only rare bacteria, but she did receive 1 dose of antibiotics prior to coming to the ER. Due to her renal disease, she was on antibiotics every 48 hours. She received fosfomycin 3 g po once on 4/30 to complete a three day course of antibiotics. Her WBC has trended down from maximum of 25 to 12.9 the morning prior to discharge. She currently denies any urinary complaints. PCP may repeat CBC to check resolving leukocytosis.  Questionable CAP: Molly Molly Ashley was requiring O2 with tachypnea but is now on room air. CXR 5/1 with questionable LLL infiltrate. She has no complaintsbut she is demented. A pneumonia may have contributed to her leukocytosis and general condition. She was started on a five day course of doxycycline 100 mg bid through 12/02/14 (received am dose 12/01/14). She was also given lasix 20 mg iv x 2 for pulmonary congestion likely secondary to initial  iv fluid administration on presentation and is -3.0L from admission. WBC trending down from maximum 25 to 12.9 the morning prior to discahrge.  Delirium w/ Underlying Dementia: Molly Molly Ashley had an episode of confusion with agitation which appears to have resolved. This was most likely secondary to in-hospital delirium in setting of acute illness and hypoglycemia. She required 0.25 mg Haldol once. She initially had her home aricept 5 mg qhs held but this was resumed.  DM type II w/ Hypoglycemia: Hemoglobin A1c 6.9 on this admission. At home she recently had her [place] stopped but was still on glipizide 5 mg qhs. This was held on this admission. She was originally on a sliding scale of insulin but had a few episodes of hypoglycemia so this was cancelled. The night prior to discharge her blood glucose was up to 302 so restarted sliding scale insulin. We will resume januvia at 50 mg daily as we do not want to start medicine that  can cause hypoglycemia. PCP can reassess this.  Acute on CKD stage IV: Baseline Cr ~1.5. Creatinine on presentation was 2.13 and prior to discharge was near baseline at 1.62. She has good po intake. PCP can recheck this.The morning before discharge after diuresis down to 1.62  Hyperlipidemia: Molly Molly Ashley was originally on simvastatin 10 mg daily. This was held due to generalized weakness and advanced age. Granddaughter agreed to stop this on discharge and PCP can reassess.  Anemia: Molly Balla's hemoglobin was stable close to her baseline of 9-10. Anemia panel in 06/2014 was consistent with anemia of chronic disease.  Pressure ulcer: Molly Molly Ashley was seen by wound care for small 0.5 x 0.75 cm superficial ulcer on the L heel without evidence of infection.  Discharge Vitals:   BP 151/68 mmHg  Pulse 77  Temp(Src) 97.8 F (36.6 C) (Oral)  Resp 18  Ht 4\' 9"  (1.448 m)  Wt 98 lb 1.7 oz (44.5 kg)  BMI 21.22 kg/m2  SpO2 95%  Discharge Physical Exam: General: Elderly female, alert, cooperative, NAD. Hard of hearing (better on L), slouched uncomfortably low in bedside chair until assisted up HEENT: PERRL, EOMI. Moist mucus membranes. Neck: Full range of motion without pain, supple, no lymphadenopathy or carotid bruits Lungs: CTAB, respirations unlabored Heart: RRR, no murmurs, gallops, or rubs Abdomen: Soft, non-tender, non-distended, BS + Extremities: No cyanosis, clubbing, or edema Neurologic: Alert & oriented x1, cranial nerves generally intact, strength grossly intact, sensation intact to light touch  Discharge Labs:  Basic Metabolic Panel:  Recent Labs Lab 11/29/14 1145 11/30/14 0528  NA 142 141  K 4.5 3.8  CL 113* 108  CO2 19* 21*  GLUCOSE 203* 203*  BUN 28* 28*  CREATININE 1.79* 1.62*  CALCIUM 8.3* 8.2*   Liver Function Tests:  Recent Labs Lab 11/27/14 0632  AST 45*  ALT 31  ALKPHOS 79  BILITOT 0.7  PROT 5.5*  ALBUMIN 2.2*   CBC:  Recent Labs Lab 11/26/14 2053  11/27/14 0632  11/29/14 1145 11/30/14 0528  WBC 22.6* 25.0*  < > 14.6* 12.9*  NEUTROABS 20.1* 20.4*  --   --   --   HGB 8.9* 8.6*  < > 9.3* 9.3*  HCT 27.3* 26.4*  < > 29.8* 29.4*  MCV 92.5 92.6  < > 95.8 95.1  PLT 261 285  < > 318 328  < > = values in this interval not displayed. CBG:  Recent Labs Lab 11/29/14 2224 11/30/14 0751 11/30/14 1214 11/30/14 1714 11/30/14 2151 12/01/14 3570  GLUCAP 258* 195* 232* 206* 302* 181*   Hemoglobin A1C:  Recent Labs Lab 11/27/14 0632  HGBA1C 6.9*   Urinalysis:  Recent Labs Lab 11/26/14 2158  COLORURINE YELLOW  LABSPEC 1.015  PHURINE 5.5  GLUCOSEU 100*  HGBUR MODERATE*  BILIRUBINUR NEGATIVE  KETONESUR NEGATIVE  PROTEINUR >300*  UROBILINOGEN 1.0  NITRITE NEGATIVE  LEUKOCYTESUR SMALL*   Signed: Kelby Aline, MD 12/01/2014, 11:15 AM    Services Ordered on Discharge: none Equipment Ordered on Discharge: none

## 2014-11-30 NOTE — Clinical Social Work Placement (Addendum)
   CLINICAL SOCIAL WORK PLACEMENT  NOTE  Date:  11/30/2014  Patient Details  Name: Molly Ashley MRN: [id number] Date of Birth: 1913/05/01  Clinical Social Work is seeking post-discharge placement for this patient at the [place] level of care (*CSW will initial, date and re-position this form in  chart as items are completed):  Yes   Patient/family provided with [facility] Work Department's list of facilities offering this level of care within the geographic area requested by the patient (or if unable, by the patient's family).  Yes   Patient/family informed of their freedom to choose among providers that offer the needed level of care, that participate in Medicare, Medicaid or managed care program needed by the patient, have an available bed and are willing to accept the patient.  Yes   Patient/family informed of [facility]'s ownership interest in [facility] and [facility], as well as of the fact that they are under no obligation to receive care at these facilities.  PASRR submitted to EDS on       PASRR number received on       Existing PASRR number confirmed on 11/30/14     FL2 transmitted to all facilities in geographic area requested by pt/family on       FL2 transmitted to all facilities within larger geographic area on 11/30/14     Patient informed that his/her managed care company has contracts with or will negotiate with certain facilities, including the following:        Yes   Patient/family informed of bed offers received.  Patient chooses bed at [facility]     Physician recommends and patient chooses bed at      Patient to be transferred to [facility] on  .  Patient to be transferred to facility by       Patient family notified on   of transfer.  Name of family member notified:        PHYSICIAN       Additional Comment:  CSW [place]  _______________________________________________ Rigoberto Noel, LCSW 11/30/2014, 3:55 PM

## 2014-11-30 NOTE — Progress Notes (Signed)
  Date: 11/30/2014  Patient name: Molly Ashley  Medical record number: [id number]  Date of birth: [birth year]   This patient's plan of care was discussed with the house staff. Please see Dr. Bebe Shaggy note for complete details. I concur with his findings.  Molly Ashley is doing well.  She has issues with confusion given her dementia.  She qualifies for a SNF and this is being coordinated.  She will complete a short course of Abx for presumed CAP.    Sid Falcon, MD 11/30/2014, 3:42 PM

## 2014-11-30 NOTE — Clinical Social Work Note (Signed)
Clinical Social Work Assessment  Patient Details  Name: Molly Ashley MRN: [id number] Date of Birth: [birth year]  Date of referral:  11/30/14               Reason for consult:  Discharge Planning                Permission sought to share information with:  Facility Art therapist granted to share information::  Yes, Verbal Permission Granted  Name::     SNFs  Agency::     Relationship::     Contact Information:     Housing/Transportation Living arrangements for the past 2 months:  Single Family Home Source of Information:  Adult Children, Other (Comment Required) (Granddaughter Systems developer) Patient Interpreter Needed:  None Criminal Activity/Legal Involvement Pertinent to Current Situation/Hospitalization:  No - Comment as needed Significant Relationships:  Adult Children, Other Family Members Lives with:  Adult Children Do you feel safe going back to the place where you live?  Yes Need for family participation in patient care:  Yes (Comment)  Care giving concerns:  Patient's family feels that they cannot manage the patient at home at this time.   Social Worker assessment / plan:  CSW contact patient's granddaughter Molly Ashley (listed contact) as no family currently at bedside. Molly Ashley reports that the patient was admitted from home where she lives with her daughter. Family is requesting placement at [facility] where the patient has been in the past. CSW explained SNF search/placement process and answered family's questions. CSW will follow up with bed offers.   Employment status:  Retired Nurse, adult PT Recommendations:  [place] / Referral to community resources:  [place]  Patient/Family's Response to care:  Patient's family is happy for assistance from treatment team in getting patient placed for short term rehab.   Patient/Family's Understanding of and Emotional Response to Diagnosis, Current  Treatment, and Prognosis:  Family appears to have good insight into reason for admission. Family is able to articulate their understanding of reason for admission and the patient's post DC needs.  Emotional Assessment Appearance:  Appears stated age Attitude/Demeanor/Rapport:  Unable to Assess Affect (typically observed):  Unable to Assess Orientation:  Oriented to Self Alcohol / Substance use:  Never Used Psych involvement (Current and /or in the community):  No (Comment)  Discharge Needs  Concerns to be addressed:  Discharge Planning Concerns Readmission within the last 30 days:  No Current discharge risk:  Physical Impairment, Cognitively Impaired Barriers to Discharge:  Continued Medical Work up   Rigoberto Noel, LCSW 11/30/2014, 8:43 AM

## 2014-11-30 NOTE — Progress Notes (Signed)
Subjective: No acute events overnight. Molly Ashley was resting comfortably in bed when I interviewed her. She denied any dysuria, SOB, fever, or any other complaint or pain other than feeling like a pin cushion for getting lab draws.   Objective: Vital signs in last 24 hours: Filed Vitals:   11/29/14 1748 11/29/14 2226 11/30/14 0627 11/30/14 0654  BP: 178/50 166/52 162/60 125/59  Pulse: 86 82 77   Temp: 98.3 F (36.8 C) 98.8 F (37.1 C) 98.4 F (36.9 C)   TempSrc: Axillary Axillary Axillary   Resp: 18 24 26    Height:      Weight:   97 lb 1.6 oz (44.044 kg) 98 lb 1.7 oz (44.5 kg)  SpO2: 100% 98% 95%    Weight change: -3 lb 12.8 oz (-1.724 kg)  Intake/Output Summary (Last 24 hours) at 11/30/14 0845 Last data filed at 11/30/14 6387  Gross per 24 hour  Intake    120 ml  Output   1550 ml  Net  -1430 ml   Physical Exam: General: Elderly female, alert, cooperative, NAD. Hard of hearing (better on L) HEENT: PERRL, EOMI. Moist mucus membranes. Neck: Full range of motion without pain, supple, no lymphadenopathy or carotid bruits Lungs: CTAB, respirations unlabored Heart: RRR, no murmurs, gallops, or rubs Abdomen: Soft, non-tender, non-distended, BS + Extremities: No cyanosis, clubbing, or edema Neurologic: Alert & oriented x1, cranial nerves generally intact, strength grossly intact, sensation intact to light touch   Lab Results: Basic Metabolic Panel:  Recent Labs Lab 11/29/14 1145 11/30/14 0528  NA 142 141  K 4.5 3.8  CL 113* 108  CO2 19* 21*  GLUCOSE 203* 203*  BUN 28* 28*  CREATININE 1.79* 1.62*  CALCIUM 8.3* 8.2*   Liver Function Tests:  Recent Labs Lab 11/27/14 0632  AST 45*  ALT 31  ALKPHOS 79  BILITOT 0.7  PROT 5.5*  ALBUMIN 2.2*   CBC:  Recent Labs Lab 11/26/14 2053 11/27/14 0632  11/29/14 1145 11/30/14 0528  WBC 22.6* 25.0*  < > 14.6* 12.9*  NEUTROABS 20.1* 20.4*  --   --   --   HGB 8.9* 8.6*  < > 9.3* 9.3*  HCT 27.3* 26.4*  < > 29.8*  29.4*  MCV 92.5 92.6  < > 95.8 95.1  PLT 261 285  < > 318 328  < > = values in this interval not displayed. CBG:  Recent Labs Lab 11/29/14 0425 11/29/14 0805 11/29/14 1030 11/29/14 1248 11/29/14 1740 11/29/14 2224  GLUCAP 134* 123* 181* 185* 176* 258*   Urinalysis:  Recent Labs Lab 11/26/14 2158  COLORURINE YELLOW  LABSPEC 1.015  PHURINE 5.5  GLUCOSEU 100*  HGBUR MODERATE*  BILIRUBINUR NEGATIVE  KETONESUR NEGATIVE  PROTEINUR >300*  UROBILINOGEN 1.0  NITRITE NEGATIVE  LEUKOCYTESUR SMALL*    Micro Results: Recent Results (from the past 240 hour(s))  Culture, blood (routine x 2)     Status: None (Preliminary result)   Collection Time: 11/26/14  8:51 PM  Result Value Ref Range Status   Specimen Description BLOOD RIGHT ARM  Final   Special Requests BOTTLES DRAWN AEROBIC AND ANAEROBIC 5CC  Final   Culture   Final           BLOOD CULTURE RECEIVED NO GROWTH TO DATE CULTURE WILL BE HELD FOR 5 DAYS BEFORE ISSUING A FINAL NEGATIVE REPORT Performed at [facility]    Report Status PENDING  Incomplete  Culture, blood (routine x 2)     Status:  None (Preliminary result)   Collection Time: 11/26/14  9:02 PM  Result Value Ref Range Status   Specimen Description BLOOD LEFT WRIST  Final   Special Requests BOTTLES DRAWN AEROBIC AND ANAEROBIC 5CC  Final   Culture   Final           BLOOD CULTURE RECEIVED NO GROWTH TO DATE CULTURE WILL BE HELD FOR 5 DAYS BEFORE ISSUING A FINAL NEGATIVE REPORT Performed at [facility]    Report Status PENDING  Incomplete  Urine culture     Status: None   Collection Time: 11/26/14  9:58 PM  Result Value Ref Range Status   Specimen Description URINE, CATHETERIZED  Final   Special Requests NONE  Final   Colony Count NO GROWTH Performed at [facility]   Final   Culture NO GROWTH Performed at [facility]   Final   Report Status 11/28/2014 FINAL  Final   Studies/Results: Dg Chest Port 1 View  11/29/2014    CLINICAL DATA:  Urinary tract infection. Chest congestion. History of diabetes.  EXAM: PORTABLE CHEST - 1 VIEW  COMPARISON:  11/28/2014  FINDINGS: The heart is enlarged. There is prominence of interstitial markings consistent with interstitial edema. There are small bilateral pleural effusions. There are no scratched a there is opacity at the medial left lung base obscuring the hemidiaphragm and suspicious for infectious infiltrate.  Previous right shoulder arthroplasty.  IMPRESSION: 1. Cardiomegaly and interstitial edema. 2. Left lower lobe infiltrate. 3. Bilateral pleural effusions.   Electronically Signed   By: Nolon Nations M.D.   On: 11/29/2014 10:38   Dg Chest Port 1 View  11/28/2014   CLINICAL DATA:  Shortness of breath.  History of diabetes.  EXAM: PORTABLE CHEST - 1 VIEW  COMPARISON:  One day prior  FINDINGS: Right shoulder arthroplasty. Midline trachea. Cardiomegaly accentuated by AP portable technique. Bilateral costophrenic angle blunting likely represents trace pleural fluid or thickening. No pneumothorax. Pulmonary interstitial thickening is felt to be slightly increased. Scarring at the left lung base.  IMPRESSION: Cardiomegaly with pulmonary interstitial thickening, felt to be slightly increased. Suspect developing pulmonary venous congestion with trace bilateral pleural effusions.   Electronically Signed   By: [name]   On: 11/28/2014 19:03   Medications: I have reviewed the patient's current medications. Scheduled Meds: . donepezil  5 mg Oral QHS  . doxycycline  100 mg Oral Q12H  . heparin  5,000 Units Subcutaneous 3 times per day  . sodium chloride  3 mL Intravenous Q12H   Continuous Infusions:   PRN Meds:.acetaminophen, polyvinyl alcohol   Assessment/Plan: 81 y/o F w/ PMHx of DM type II, admitted for UTI.   Delirium w/ Underlying Dementia: Confusion w/ agitation appears to have resolved. Most likely 2/2 in-hospital delirium in setting of acute illness and  hypoglycemia yesterday. Required 0.25 mg Haldol two nights ago. -Continue to monitor  -Bedside sitter -Continue Aricept 5 mg qhs  Questionable CAP: Molly Vanpelt was requiring O2 with tachypnea, now on room air. CXR 5/1 with questionable LLL infiltrate. She has no complaints  but she is demented. Diuresed 1.5 L yesterday afer lasix 20 mg iv x 2 over past two days (pulmonary congestion most likely to IVF administration on presentation). WBC trending down from maximum 25 to 12.9 this morning. -cont doxycycline 100 mg bid for 5 days (through 12/02/14) -Supplemental O2 prn  DM type II w/ Hypoglycemia: Resolved.  -Ensure adequate po intake -CBG's AC/HS -Restart ISS if blood  sugars increase  Uncomplicated UTI: Patient denies urinary symptoms at this time. Given Fosfomycin 3 g po once 11/28/14. WBC trending down from maximum 25 to 12.9 this morning. -Continue to monitor for symptoms.   Acute on CKD stage IV: Baseline Cr ~1.5. This morning after diuresis down to 1.62 -cont to monitor -Encourage adequate po intake.   Anemia: Stable. -Monitor for signs of bleeding  Pressure ulcer: Stable. No evidence of infection currently. -Appreciate WOC  DVT/PE PPx: Heparin [facility]  Dispo: Disposition is deferred at this time, awaiting improvement of current medical problems.  Anticipated discharge tomorrow.   The patient does have a current PCP (Rafaela Arville Care, MD) and does need an [facility] hospital follow-up appointment after discharge.  The patient does not know have transportation limitations that hinder transportation to clinic appointments.  .Services Needed at time of discharge: Y = Yes, Blank = No PT:   OT:   RN:   Equipment:   Other:     LOS: 3 days   Kelby Aline, MD 11/30/2014, 8:45 AM

## 2014-12-01 LAB — GLUCOSE, CAPILLARY
Glucose-Capillary: 206 mg/dL — ABNORMAL HIGH (ref 70–99)
Glucose-Capillary: 181 mg/dL — ABNORMAL HIGH (ref 70–99)

## 2014-12-01 MED ORDER — SITAGLIPTIN PHOSPHATE 50 MG PO TABS: 50.0000 mg | ORAL_TABLET | Freq: Every day | ORAL | Status: AC

## 2014-12-01 MED ORDER — INSULIN ASPART 100 UNIT/ML ~~LOC~~ SOLN
0.0000 [IU] | Freq: Three times a day (TID) | SUBCUTANEOUS | Status: DC
  Administered 2014-12-01: 2 [IU] via SUBCUTANEOUS

## 2014-12-01 MED ORDER — DOXYCYCLINE HYCLATE 100 MG PO TABS: 100.0000 mg | ORAL_TABLET | Freq: Two times a day (BID) | ORAL | Status: AC

## 2014-12-01 MED ORDER — INSULIN ASPART 100 UNIT/ML ~~LOC~~ SOLN: 0.0000 [IU] | Freq: Every day | SUBCUTANEOUS | Status: DC

## 2014-12-01 NOTE — Progress Notes (Signed)
  Date: 12/01/2014  Patient name: Molly Ashley  Medical record number: [id number]  Date of birth: 1912-11-06   This patient's plan of care was discussed with the house staff. Please see Dr. Bebe Shaggy note for complete details. I concur with his findings.  [name] will be discharged to nursing home today.  She came in with confusion, she was felt to be suffering from a UTI.  She also had some AKI.  Likely due to fluids she received for her urinary symptoms, she did develop a little pulmonary edema which improved with lasix.  She was not requiring O2 on discharge except for comfort.  Her last CXR did note a possible developing infiltrate and she was treated with a very short course of doxycycline for presumed CAP (this is likely a partial explanation for her confusion at home as well given her UC did not grow any bugs, also, she was dry on initial exam and her CXR at that time had some changes on the left which could have been a developing pneumonia).  She did have confusion which waxed and waned while she was here, much improved with family in the room.  This was likely related to her dementia.  We stopped her glipizide and restarted a low dose januvia due to elevated BS.  She had low blood sugars in the hospital, hence stopping the medication that could worsen hypoglycemia.  She will follow up with her PCP when she is able, if/when she goes home from rehab/SNF.    Sid Falcon, MD 12/01/2014, 3:51 PM

## 2014-12-01 NOTE — Progress Notes (Signed)
Occupational Therapy Treatment Patient Details Name: KHRYSTYNE ARPIN MRN: [id number] DOB: 09/13/1912 Today's Date: 12/01/2014    History of present illness Patient is a 81 yo female admitted 11/26/14 with weakness, confusion, falls.  Patient with UTI.  PMH:  DM arthritis, ulcer Lt heel, HOH, dementia   OT comments  Pt progressing with with all goals. Pt seems to be closer to her baseline cognitive status and is very motivated to participate in therapy.  Follow Up Recommendations  SNF;Supervision/Assistance - 24 hour    Equipment Recommendations  None recommended by OT    Recommendations for Other Services      Precautions / Restrictions Precautions Precautions: Fall Restrictions Weight Bearing Restrictions: No       Mobility Bed Mobility Overal bed mobility: Needs Assistance Bed Mobility: Supine to Sit     Supine to sit: Min assist     General bed mobility comments: pt required assist to bring hip around to sit straight and to move forward and get both feet on the floor.  Transfers Overall transfer level: Needs assistance Equipment used: Rolling walker (2 wheeled) Transfers: Sit to/from Stand Sit to Stand: Min assist Stand pivot transfers: Min assist       General transfer comment: Verbal cues for hand placement.  Assist to steady to rise to standing.  Patient able to take several steps to pivot to [facility] with min assist.  Patient stood for pericare, and stepped to pivot back to bed.  Min assist for balance/safety.  Patient with dyspnea 3/4 following transfers.    Balance Overall balance assessment: Needs assistance Sitting-balance support: Feet supported Sitting balance-Leahy Scale: Good     Standing balance support: Bilateral upper extremity supported;During functional activity Standing balance-Leahy Scale: Fair Standing balance comment: Pt had increased balance standing at sink this morning.                   ADL Overall ADL's : Needs  assistance/impaired     Grooming: Wash/dry hands;Wash/dry face;Min guard;Standing                   Toilet Transfer: Minimal Counsellor Details (indicate cue type and reason): pt walked to bathroom with min guard. pt toileted with mod assist to clean self thoroughly. Toileting- Clothing Manipulation and Hygiene: Minimal assistance       Functional mobility during ADLs: Min guard General ADL Comments: Pt much improved today and closer to her level of functioning at the SNF before she came in.      Vision                     Perception     Praxis      Cognition   Behavior During Therapy: [facility] for tasks assessed/performed Overall Cognitive Status: History of cognitive impairments - at baseline Area of Impairment: Memory;Orientation;Safety/judgement Orientation Level: Disoriented to;Time   Memory: Decreased short-term memory  Following Commands: Follows one step commands consistently Safety/Judgement: Decreased awareness of safety;Decreased awareness of deficits     General Comments: Pt much more lucid today.  Granddaughter states that she is much closer to her baseline.    Extremity/Trunk Assessment               Exercises     Shoulder Instructions       General Comments      Pertinent Vitals/ Pain       Pain Assessment: No/denies pain  Home Living  Prior Functioning/Environment              Frequency Min 2X/week     Progress Toward Goals  OT Goals(current goals can now be found in the care plan section)  Progress towards OT goals: Progressing toward goals  Acute Rehab OT Goals Patient Stated Goal: no more needles OT Goal Formulation: With patient/family Time For Goal Achievement: 12/06/14 Potential to Achieve Goals: Good ADL Goals Pt Will Perform Grooming: with supervision;standing Pt Will Perform Upper Body Dressing: with  supervision;sitting Pt Will Perform Lower Body Dressing: with supervision;sit to/from stand Pt Will Transfer to Toilet: with supervision;ambulating;regular height toilet Pt Will Perform Toileting - Clothing Manipulation and hygiene: with supervision;sit to/from stand Additional ADL Goal #1: Pt will perform bed mobility with supervision in preparation for ADL.  Plan Discharge plan remains appropriate    Co-evaluation                 End of Session Equipment Utilized During Treatment: Rolling walker   Activity Tolerance     Patient Left Other (comment) (leaving to go to SNF on stretcher.)   Nurse Communication Mobility status        Time: 1202-1220 OT Time Calculation (min): 18 min  Charges: OT General Charges $OT Visit: 1 Procedure OT Treatments $Self Care/Home Management : 8-22 mins  Glenford Peers 12/01/2014, 12:30 PM  [phone]

## 2014-12-01 NOTE — Progress Notes (Signed)
Pt. Being D/C'd to [facility]. CSW called pt. Daughter and made her aware of transport plans. Removed IV. PTAR to transport. Attempted to call report to receiving nurse at facility. Awaiting call back. All needs met. No further needs noted at this time.  

## 2014-12-01 NOTE — Progress Notes (Signed)
Subjective: No acute events overnight. Ms Allen was slouched down in her bedside chair during rounds. We helped her to be more upright but asked RN to assist further. Her only complaint was that she needed help up and said she kept trying to adjust herself because the blanket she was sitting on was bunched up under her. We went over the discharge plan with her and she was agreeable. Spoke to RN who notes she does not require O2 but asked for nasal cannula because she thinks this makes her feel better.    Objective: Vital signs in last 24 hours: Filed Vitals:   11/30/14 0654 11/30/14 1427 11/30/14 2152 12/01/14 0516  BP: 125/59 166/39 150/60 151/68  Pulse:  79  77  Temp:  98.3 F (36.8 C) 98.9 F (37.2 C) 97.8 F (36.6 C)  TempSrc:  Oral Oral Oral  Resp:  24 16 18   Height:      Weight: 98 lb 1.7 oz (44.5 kg)     SpO2:  98%  95%   Weight change:   Intake/Output Summary (Last 24 hours) at 12/01/14 1108 Last data filed at 12/01/14 0931  Gross per 24 hour  Intake    213 ml  Output    850 ml  Net   -637 ml   Physical Exam: General: Elderly female, alert, cooperative, NAD. Hard of hearing (better on L), slouched uncomfortably low in bedside chair until assisted up HEENT: PERRL, EOMI. Moist mucus membranes. Neck: Full range of motion without pain, supple, no lymphadenopathy or carotid bruits Lungs: CTAB, respirations unlabored Heart: RRR, no murmurs, gallops, or rubs Abdomen: Soft, non-tender, non-distended, BS + Extremities: No cyanosis, clubbing, or edema Neurologic: Alert & oriented x1, cranial nerves generally intact, strength grossly intact, sensation intact to light touch   Lab Results: Basic Metabolic Panel:  Recent Labs Lab 11/29/14 1145 11/30/14 0528  NA 142 141  K 4.5 3.8  CL 113* 108  CO2 19* 21*  GLUCOSE 203* 203*  BUN 28* 28*  CREATININE 1.79* 1.62*  CALCIUM 8.3* 8.2*   Liver Function Tests:  Recent Labs Lab 11/27/14 0632  AST 45*  ALT 31    ALKPHOS 79  BILITOT 0.7  PROT 5.5*  ALBUMIN 2.2*   CBC:  Recent Labs Lab 11/26/14 2053 11/27/14 0632  11/29/14 1145 11/30/14 0528  WBC 22.6* 25.0*  < > 14.6* 12.9*  NEUTROABS 20.1* 20.4*  --   --   --   HGB 8.9* 8.6*  < > 9.3* 9.3*  HCT 27.3* 26.4*  < > 29.8* 29.4*  MCV 92.5 92.6  < > 95.8 95.1  PLT 261 285  < > 318 328  < > = values in this interval not displayed. CBG:  Recent Labs Lab 11/29/14 2224 11/30/14 0751 11/30/14 1214 11/30/14 1714 11/30/14 2151 12/01/14 0815  GLUCAP 258* 195* 232* 206* 302* 181*   Urinalysis:  Recent Labs Lab 11/26/14 2158  COLORURINE YELLOW  LABSPEC 1.015  PHURINE 5.5  GLUCOSEU 100*  HGBUR MODERATE*  BILIRUBINUR NEGATIVE  KETONESUR NEGATIVE  PROTEINUR >300*  UROBILINOGEN 1.0  NITRITE NEGATIVE  LEUKOCYTESUR SMALL*    Micro Results: Recent Results (from the past 240 hour(s))  Culture, blood (routine x 2)     Status: None (Preliminary result)   Collection Time: 11/26/14  8:51 PM  Result Value Ref Range Status   Specimen Description BLOOD RIGHT ARM  Final   Special Requests BOTTLES DRAWN AEROBIC AND ANAEROBIC 5CC  Final   Culture  Final           BLOOD CULTURE RECEIVED NO GROWTH TO DATE CULTURE WILL BE HELD FOR 5 DAYS BEFORE ISSUING A FINAL NEGATIVE REPORT Performed at [facility]    Report Status PENDING  Incomplete  Culture, blood (routine x 2)     Status: None (Preliminary result)   Collection Time: 11/26/14  9:02 PM  Result Value Ref Range Status   Specimen Description BLOOD LEFT WRIST  Final   Special Requests BOTTLES DRAWN AEROBIC AND ANAEROBIC 5CC  Final   Culture   Final           BLOOD CULTURE RECEIVED NO GROWTH TO DATE CULTURE WILL BE HELD FOR 5 DAYS BEFORE ISSUING A FINAL NEGATIVE REPORT Performed at [facility]    Report Status PENDING  Incomplete  Urine culture     Status: None   Collection Time: 11/26/14  9:58 PM  Result Value Ref Range Status   Specimen Description URINE,  CATHETERIZED  Final   Special Requests NONE  Final   Colony Count NO GROWTH Performed at [facility]   Final   Culture NO GROWTH Performed at [facility]   Final   Report Status 11/28/2014 FINAL  Final   Studies/Results: No results found. Medications: I have reviewed the patient's current medications. Scheduled Meds: . donepezil  5 mg Oral QHS  . doxycycline  100 mg Oral Q12H  . heparin  5,000 Units Subcutaneous 3 times per day  . insulin aspart  0-9 Units Subcutaneous TID WC  . sodium chloride  3 mL Intravenous Q12H   Continuous Infusions:   PRN Meds:.acetaminophen, polyvinyl alcohol   Assessment/Plan: 81 y/o F w/ PMHx of DM type II, admitted for UTI.   Delirium w/ Underlying Dementia: Confusion w/ agitation continues to be resolved. Most likely 2/2 in-hospital delirium in setting of acute illness and hypoglycemia yesterday. Required 0.25 mg Haldol three nights ago. -Continue to monitor  -Bedside sitter -Continue Aricept 5 mg qhs  Questionable CAP: Ms Navarez was requiring O2 with tachypnea, now on room air (she has no O2 requirement but asked for nasal cannula as she thinks this makes her feel better). CXR 5/1 with questionable LLL infiltrate. She has no complaints  but she is demented. Diuresed 1.5 L after lasix 20 mg iv x 2 over two days (pulmonary congestion most likely to IVF administration on presentation). WBC trending down from maximum 25 to 12.9 yesterday morning. -cont doxycycline 100 mg bid for 5 days (through 12/02/14) -Supplemental O2 prn  DM type II w/ Hypoglycemia: Hypoglycemia resolved and pm CBG 308. Want to avoid medicines that can cause hypoglycemia. -Ensure adequate po intake -CBG's AC/HS -Restarted ISS -januvia 50 mg daily  Uncomplicated UTI: Patient denies urinary symptoms at this time. Given Fosfomycin 3 g po once 11/28/14. WBC trending down from maximum 25 to 12.9 yesterday morning. -Continue to monitor for symptoms.   Acute on CKD  stage IV: Baseline Cr ~1.5. Yesterday morning after diuresis down to 1.62 -cont to monitor -Encourage adequate po intake.   Anemia: Stable. -Monitor for signs of bleeding  Pressure ulcer: Stable. No evidence of infection currently. -Appreciate WOC  DVT/PE PPx: Heparin [place]  Dispo: today to SNF  The patient does have a current PCP (Rafaela Arville Care, MD) and does need an [facility] hospital follow-up appointment after discharge.  The patient does not know have transportation limitations that hinder transportation to clinic appointments.  .Services Needed at time of  discharge: Y = Yes, Blank = No PT:   OT:   RN:   Equipment:   Other:     LOS: 4 days   Kelby Aline, MD 12/01/2014, 11:08 AM

## 2014-12-01 NOTE — Clinical Social Work Placement (Signed)
   CLINICAL SOCIAL WORK PLACEMENT  NOTE  Date:  12/01/2014  Patient Details  Name: Molly Ashley MRN: [id number] Date of Birth: [birth year]  Clinical Social Work is seeking post-discharge placement for this patient at the [place] level of care (*CSW will initial, date and re-position this form in  chart as items are completed):  Yes   Patient/family provided with [place] Work Department's list of facilities offering this level of care within the geographic area requested by the patient (or if unable, by the patient's family).  Yes   Patient/family informed of their freedom to choose among providers that offer the needed level of care, that participate in Medicare, Medicaid or managed care program needed by the patient, have an available bed and are willing to accept the patient.  Yes   Patient/family informed of [facility]'s ownership interest in [facility] and [facility], as well as of the fact that they are under no obligation to receive care at these facilities.  PASRR submitted to EDS on       PASRR number received on       Existing PASRR number confirmed on 11/30/14     FL2 transmitted to all facilities in geographic area requested by pt/family on       FL2 transmitted to all facilities within larger geographic area on 11/30/14     Patient informed that his/her managed care company has contracts with or will negotiate with certain facilities, including the following:        Yes   Patient/family informed of bed offers received.  Patient chooses bed at [facility]     Physician recommends and patient chooses bed at      Patient to be transferred to [facility] on 12/01/14.  Patient to be transferred to facility by Ambulance     Patient family notified on 12/01/14 of transfer.  Name of family member notified:  Grover Canavan     PHYSICIAN       Additional Comment:  Per MD patient ready for DC to [facility], patient,  patient's family, and facility notified of DC. RN given number for report. DC packet on chart. Ambulance transport requested for patient. CSW signing off.   _______________________________________________ Rigoberto Noel, LCSW 12/01/2014, 11:46 AM

## 2014-12-03 LAB — CULTURE, BLOOD (ROUTINE X 2)
Culture: NO GROWTH
Culture: NO GROWTH

## 2015-01-29 DEATH — deceased

## 2015-02-25 IMAGING — CR DG CHEST 1V PORT
1 series · 1 of 1 positions shown · non-contrast
Comparison: 06/30/2014.  05/04/2013.

CLINICAL DATA: Fracture of the the LEFT humerus.

EXAM:
PORTABLE CHEST - 1 VIEW

[AP]
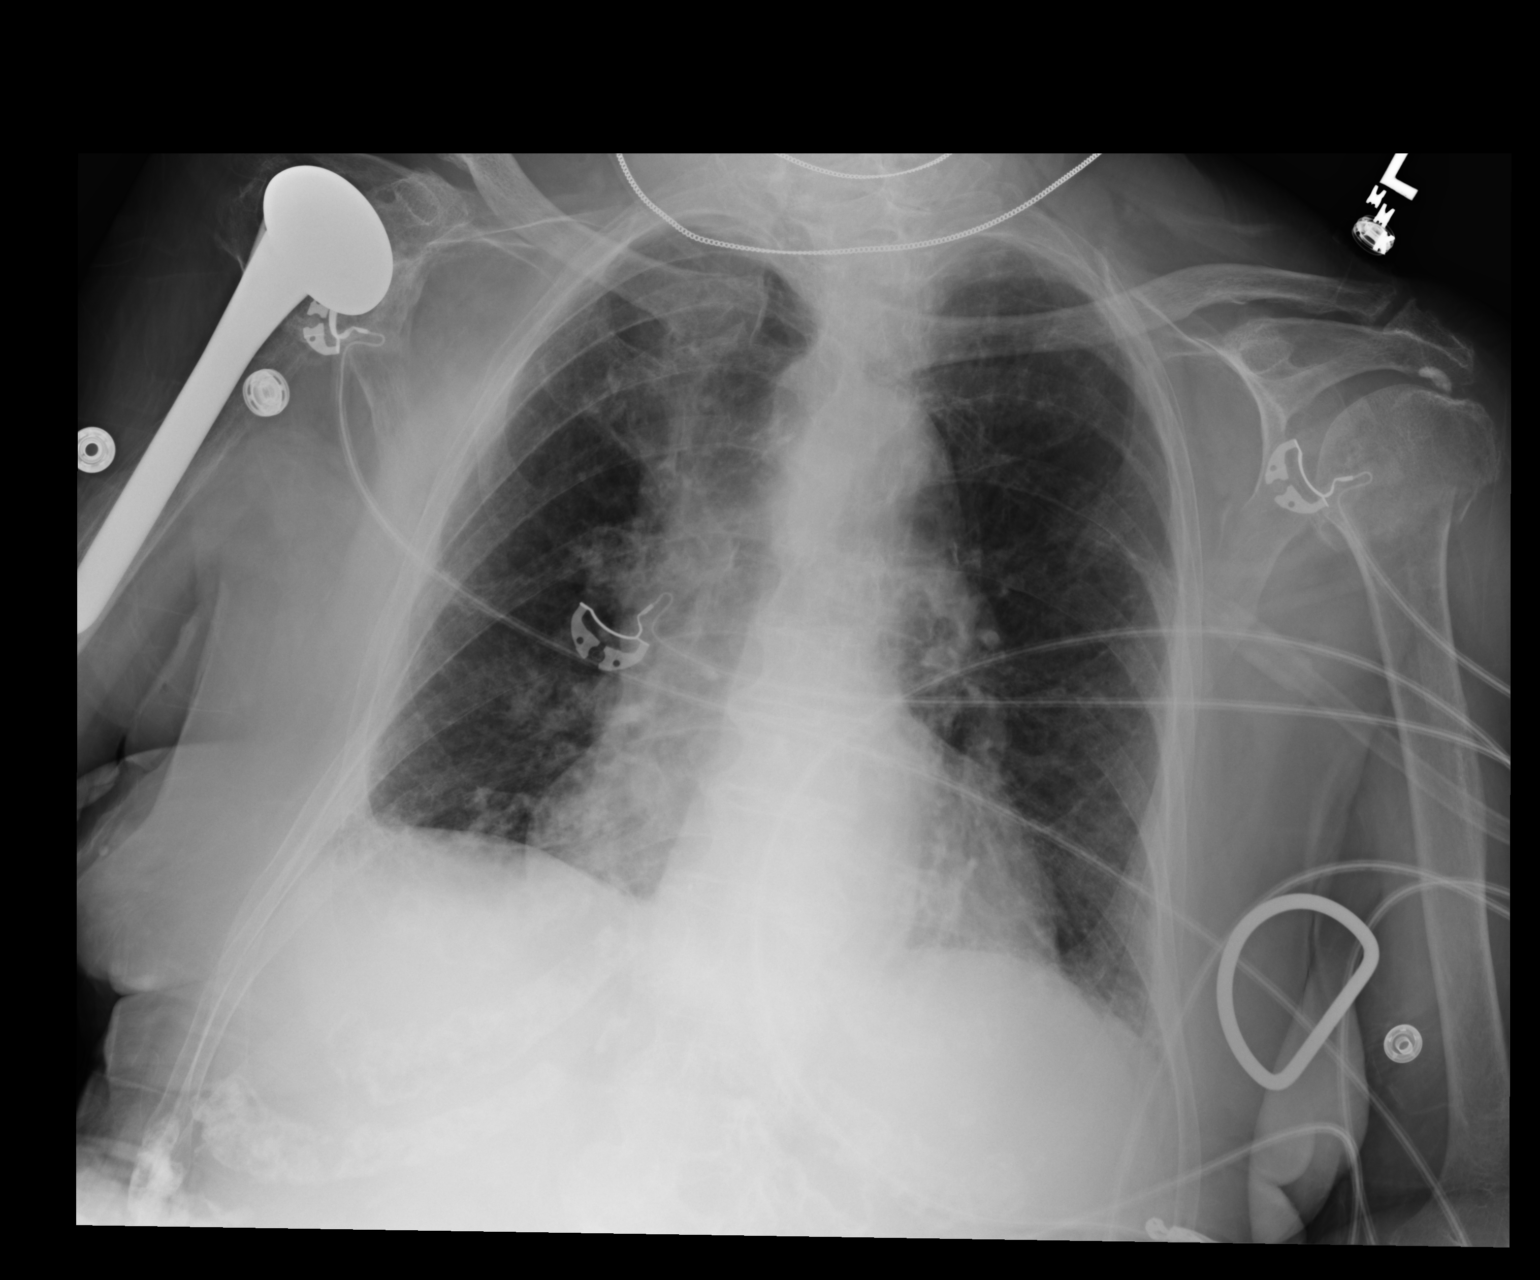

[1 of 1 positions shown; findings below may reference images not displayed]

FINDINGS: Comminuted proximal LEFT humerus fracture is noted along with
calcific tendinitis.

Cardiomegaly. Tortuous thoracic aorta. Aortic arch atherosclerosis.
RIGHT shoulder hemiarthroplasty noted. There is no acute
cardiopulmonary disease. Age related interstitial opacity, most
prominent at the lung bases. No airspace disease. No effusion. No
displaced rib fractures or pneumothorax.
IMPRESSION: 1. Cardiomegaly without failure.
2. No acute cardiopulmonary disease.

## 2015-03-16 IMAGING — CR DG CHEST 1V PORT
1 series · 1 of 1 positions shown · non-contrast
Comparison: July 04, 2014

CLINICAL DATA: Shortness of Breath

EXAM:
PORTABLE CHEST - 1 VIEW

[AP]
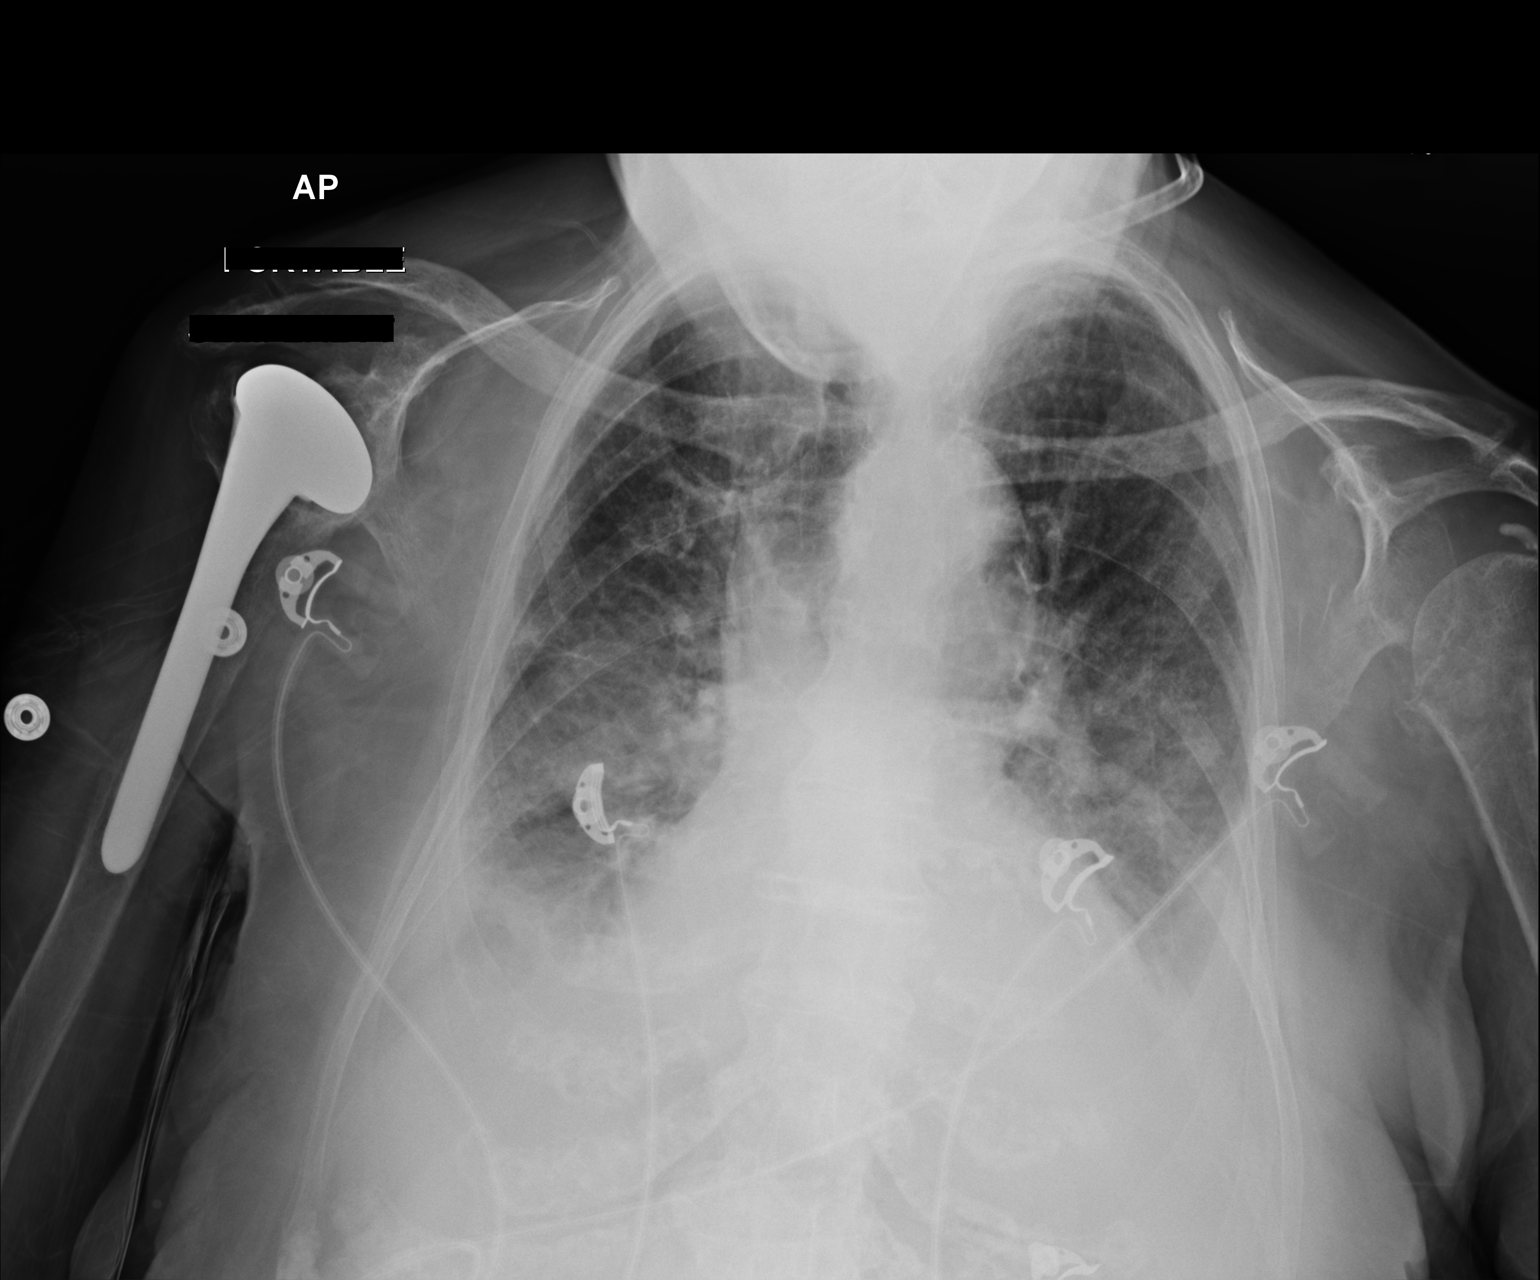

[1 of 1 positions shown; findings below may reference images not displayed]

FINDINGS: There remains moderate interstitial edema with bibasilar
consolidation. There are bilateral effusions with cardiomegaly. The
pulmonary vascularity appears within normal limits. No adenopathy.
There is evidence of old trauma involving the proximal left humeral
metaphysis. There is a total shoulder replacement on the right.
IMPRESSION: Persistent congestive heart failure. Question superimposed pneumonia
in both lower lobes. Both pneumonia and congestive heart failure may
exist concurrently.

## 2015-07-26 IMAGING — CR DG CHEST 1V PORT
1 series · 1 of 1 positions shown · non-contrast
Comparison: One day prior

CLINICAL DATA: Shortness of breath.  History of diabetes.

EXAM:
PORTABLE CHEST - 1 VIEW

[AP]
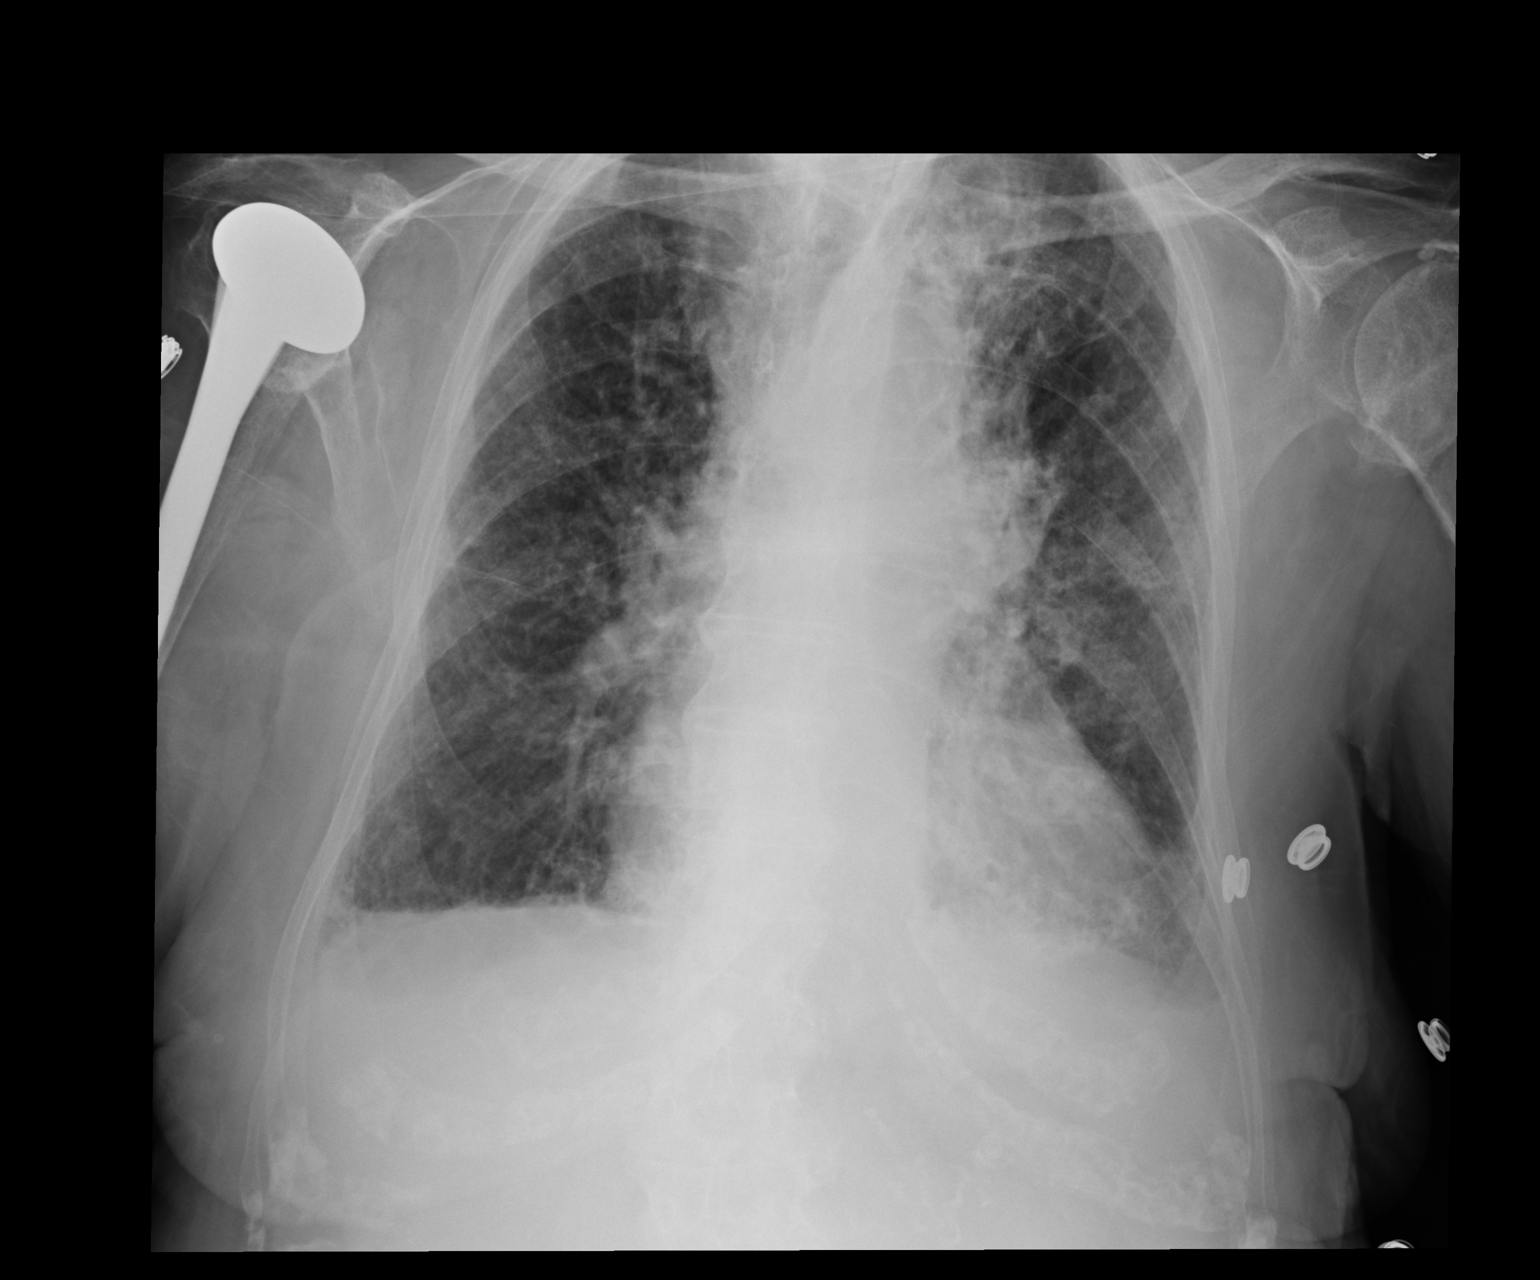

[1 of 1 positions shown; findings below may reference images not displayed]

FINDINGS: Right shoulder arthroplasty. Midline trachea. Cardiomegaly
accentuated by AP portable technique. Bilateral costophrenic angle
blunting likely represents trace pleural fluid or thickening. No
pneumothorax. Pulmonary interstitial thickening is felt to be
slightly increased. Scarring at the left lung base.
IMPRESSION: Cardiomegaly with pulmonary interstitial thickening, felt to be
slightly increased. Suspect developing pulmonary venous congestion
with trace bilateral pleural effusions.
